# Patient Record
Sex: Male | Born: 1950 | Race: White | Hispanic: No | Marital: Married | Smoking: Current every day smoker
Health system: Southern US, Community
[De-identification: ages and names within clinical notes are randomized; demographics above are authoritative.]

## PROBLEM LIST (undated history)

## (undated) DIAGNOSIS — E119 Type 2 diabetes mellitus without complications: Secondary | ICD-10-CM

## (undated) HISTORY — PX: TONSILLECTOMY: SUR1361

## (undated) HISTORY — PX: CYSTECTOMY: SUR359

---

## 2019-01-15 DIAGNOSIS — E782 Mixed hyperlipidemia: Secondary | ICD-10-CM | POA: Insufficient documentation

## 2019-01-15 DIAGNOSIS — E559 Vitamin D deficiency, unspecified: Secondary | ICD-10-CM | POA: Insufficient documentation

## 2019-01-15 DIAGNOSIS — E875 Hyperkalemia: Secondary | ICD-10-CM | POA: Insufficient documentation

## 2019-01-15 DIAGNOSIS — I499 Cardiac arrhythmia, unspecified: Secondary | ICD-10-CM | POA: Insufficient documentation

## 2019-01-15 DIAGNOSIS — N179 Acute kidney failure, unspecified: Secondary | ICD-10-CM | POA: Insufficient documentation

## 2019-01-15 DIAGNOSIS — I10 Essential (primary) hypertension: Secondary | ICD-10-CM | POA: Insufficient documentation

## 2019-07-16 DIAGNOSIS — E119 Type 2 diabetes mellitus without complications: Secondary | ICD-10-CM | POA: Insufficient documentation

## 2019-07-16 DIAGNOSIS — N1831 Chronic kidney disease, stage 3a: Secondary | ICD-10-CM | POA: Insufficient documentation

## 2020-01-21 DIAGNOSIS — D72828 Other elevated white blood cell count: Secondary | ICD-10-CM | POA: Insufficient documentation

## 2021-10-10 DIAGNOSIS — H9201 Otalgia, right ear: Secondary | ICD-10-CM | POA: Diagnosis not present

## 2021-10-10 DIAGNOSIS — Z720 Tobacco use: Secondary | ICD-10-CM | POA: Diagnosis not present

## 2021-10-10 DIAGNOSIS — J449 Chronic obstructive pulmonary disease, unspecified: Secondary | ICD-10-CM | POA: Diagnosis not present

## 2021-10-10 DIAGNOSIS — Z131 Encounter for screening for diabetes mellitus: Secondary | ICD-10-CM | POA: Diagnosis not present

## 2021-10-10 DIAGNOSIS — E119 Type 2 diabetes mellitus without complications: Secondary | ICD-10-CM | POA: Diagnosis not present

## 2021-10-10 DIAGNOSIS — Z87438 Personal history of other diseases of male genital organs: Secondary | ICD-10-CM | POA: Diagnosis not present

## 2021-10-10 DIAGNOSIS — Z13220 Encounter for screening for lipoid disorders: Secondary | ICD-10-CM | POA: Diagnosis not present

## 2021-10-10 DIAGNOSIS — R351 Nocturia: Secondary | ICD-10-CM | POA: Diagnosis not present

## 2021-10-10 DIAGNOSIS — R42 Dizziness and giddiness: Secondary | ICD-10-CM | POA: Diagnosis not present

## 2021-10-10 DIAGNOSIS — Z8679 Personal history of other diseases of the circulatory system: Secondary | ICD-10-CM | POA: Diagnosis not present

## 2021-10-10 DIAGNOSIS — N400 Enlarged prostate without lower urinary tract symptoms: Secondary | ICD-10-CM | POA: Diagnosis not present

## 2021-11-09 DIAGNOSIS — D75839 Thrombocytosis, unspecified: Secondary | ICD-10-CM | POA: Diagnosis not present

## 2021-11-09 DIAGNOSIS — R42 Dizziness and giddiness: Secondary | ICD-10-CM | POA: Diagnosis not present

## 2021-11-09 DIAGNOSIS — G919 Hydrocephalus, unspecified: Secondary | ICD-10-CM | POA: Diagnosis not present

## 2021-11-09 DIAGNOSIS — Z79899 Other long term (current) drug therapy: Secondary | ICD-10-CM | POA: Diagnosis not present

## 2021-11-09 DIAGNOSIS — R7989 Other specified abnormal findings of blood chemistry: Secondary | ICD-10-CM | POA: Diagnosis not present

## 2021-11-09 DIAGNOSIS — G912 (Idiopathic) normal pressure hydrocephalus: Secondary | ICD-10-CM | POA: Diagnosis not present

## 2021-11-09 DIAGNOSIS — R9431 Abnormal electrocardiogram [ECG] [EKG]: Secondary | ICD-10-CM | POA: Diagnosis not present

## 2021-11-09 DIAGNOSIS — R519 Headache, unspecified: Secondary | ICD-10-CM | POA: Diagnosis not present

## 2021-11-16 ENCOUNTER — Other Ambulatory Visit (HOSPITAL_COMMUNITY): Payer: Self-pay | Admitting: Neurology

## 2021-11-16 ENCOUNTER — Other Ambulatory Visit: Payer: Self-pay | Admitting: Neurology

## 2021-11-16 DIAGNOSIS — G959 Disease of spinal cord, unspecified: Secondary | ICD-10-CM

## 2021-11-16 DIAGNOSIS — R42 Dizziness and giddiness: Secondary | ICD-10-CM | POA: Diagnosis not present

## 2021-11-16 DIAGNOSIS — R296 Repeated falls: Secondary | ICD-10-CM | POA: Diagnosis not present

## 2021-11-16 DIAGNOSIS — E1143 Type 2 diabetes mellitus with diabetic autonomic (poly)neuropathy: Secondary | ICD-10-CM | POA: Diagnosis not present

## 2021-11-16 DIAGNOSIS — I951 Orthostatic hypotension: Secondary | ICD-10-CM | POA: Diagnosis not present

## 2021-11-22 DIAGNOSIS — R279 Unspecified lack of coordination: Secondary | ICD-10-CM | POA: Diagnosis not present

## 2021-11-22 DIAGNOSIS — Z9181 History of falling: Secondary | ICD-10-CM | POA: Diagnosis not present

## 2021-11-22 DIAGNOSIS — R2689 Other abnormalities of gait and mobility: Secondary | ICD-10-CM | POA: Diagnosis not present

## 2021-11-22 DIAGNOSIS — R296 Repeated falls: Secondary | ICD-10-CM | POA: Diagnosis not present

## 2021-11-29 ENCOUNTER — Ambulatory Visit (HOSPITAL_COMMUNITY)
Admission: RE | Admit: 2021-11-29 | Discharge: 2021-11-29 | Disposition: A | Discharge status: Home / Self Care | Payer: Medicare HMO | Source: Ambulatory Visit | Attending: Neurology | Admitting: Neurology

## 2021-11-29 ENCOUNTER — Other Ambulatory Visit: Payer: Self-pay

## 2021-11-29 DIAGNOSIS — R296 Repeated falls: Secondary | ICD-10-CM

## 2021-11-29 DIAGNOSIS — G319 Degenerative disease of nervous system, unspecified: Secondary | ICD-10-CM | POA: Diagnosis not present

## 2021-11-29 DIAGNOSIS — I639 Cerebral infarction, unspecified: Secondary | ICD-10-CM | POA: Diagnosis not present

## 2021-11-29 DIAGNOSIS — G959 Disease of spinal cord, unspecified: Secondary | ICD-10-CM | POA: Diagnosis not present

## 2021-11-29 DIAGNOSIS — R42 Dizziness and giddiness: Secondary | ICD-10-CM | POA: Diagnosis not present

## 2021-11-29 DIAGNOSIS — E1143 Type 2 diabetes mellitus with diabetic autonomic (poly)neuropathy: Secondary | ICD-10-CM | POA: Diagnosis not present

## 2021-11-29 DIAGNOSIS — I951 Orthostatic hypotension: Secondary | ICD-10-CM | POA: Diagnosis not present

## 2021-11-29 DIAGNOSIS — M2578 Osteophyte, vertebrae: Secondary | ICD-10-CM | POA: Diagnosis not present

## 2021-11-29 DIAGNOSIS — M542 Cervicalgia: Secondary | ICD-10-CM | POA: Diagnosis not present

## 2021-11-29 IMAGING — MR MR HEAD W/O CM
19 of 20 series · 44 of 48 positions shown · non-contrast
Comparison: Head CT [DATE].

CLINICAL DATA: Provided history: Recurrent falls. Cervical
myelopathy. Disease of spinal cord, unspecified.

EXAM:
MRI HEAD WITHOUT CONTRAST
TECHNIQUE: Multiplanar, multiecho pulse sequences of the brain and surrounding
structures were obtained without intravenous contrast.

[Series 5: DWI · axial · 4.0mm · 0.88mm/px · z∈[-64,+74]mm · 2 of 36 slices shown (1 of 6)]
[im 1/36]
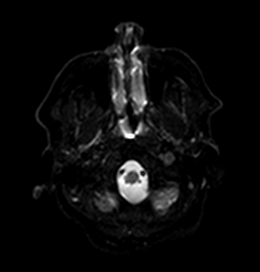
[im 36/36]
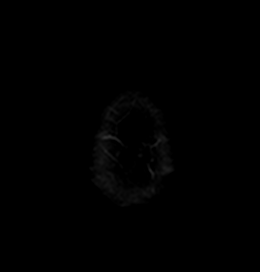

[Series 5: DWI · axial · 4.0mm · 0.88mm/px · z∈[-64,+74]mm · 2 of 36 slices shown (2 of 6)]
[im 1/36]
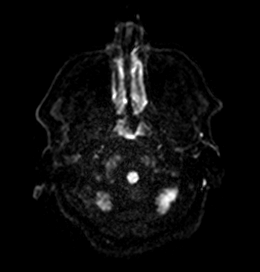
[im 36/36]
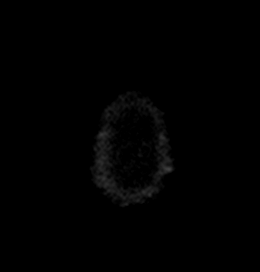

[Series 6: DWI · axial · 4.0mm · 0.88mm/px · z∈[-64,+74]mm · 2 of 36 slices shown (3 of 6)]
[im 1/36]
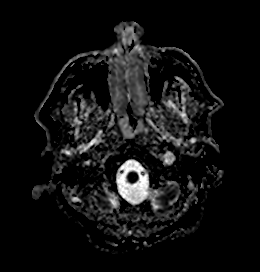
[im 36/36]
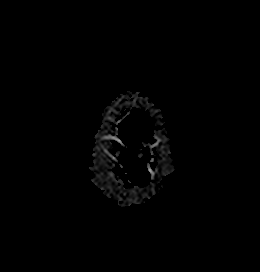

[Series 7: DWI · coronal · 5.0mm · 0.88mm/px · 2 of 30 slices shown (4 of 6)]
[im 1/30]
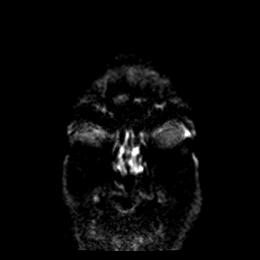
[im 30/30]
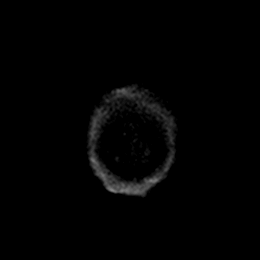

[Series 7: DWI · coronal · 5.0mm · 0.88mm/px · 2 of 30 slices shown (5 of 6)]
[im 1/30]
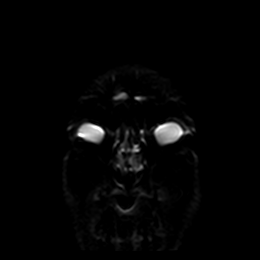
[im 30/30]
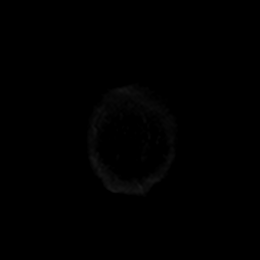

[Series 8: DWI · coronal · 5.0mm · 0.88mm/px · 2 of 30 slices shown (6 of 6)]
[im 1/30]
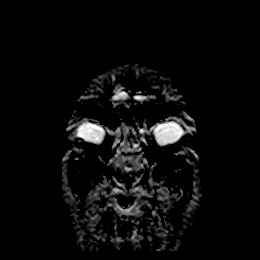
[im 30/30]
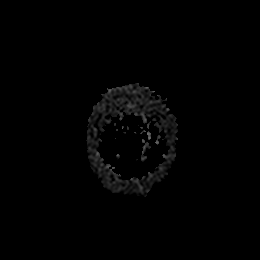

[Series 9: T1 · sagittal · 5.0mm · 0.94mm/px · 2 of 23 slices shown (1 of 2)]
[im 1/23]
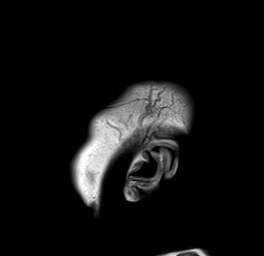
[im 23/23]
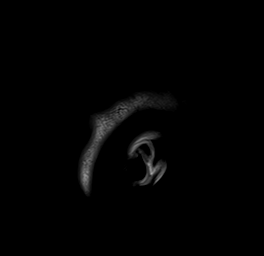

[Series 10: T2 · axial · 5.0mm · 0.72mm/px · z∈[-61,+70]mm · 2 of 20 slices shown (1 of 4)]
[im 1/20]
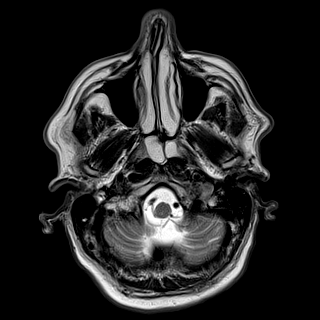
[im 20/20]
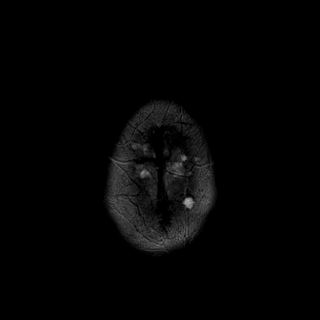

[Series 11: ax hemo · axial · 5.0mm · 0.86mm/px · z∈[-65,+77]mm · 2 of 25 slices shown]
[im 1/25]
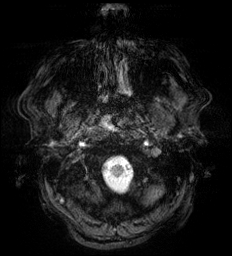
[im 25/25]
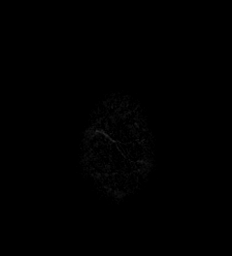

[Series 12: FLAIR · axial · 4.0mm · 0.43mm/px · z∈[-63,+76]mm · 3 of 36 slices shown]
[im 1/36]
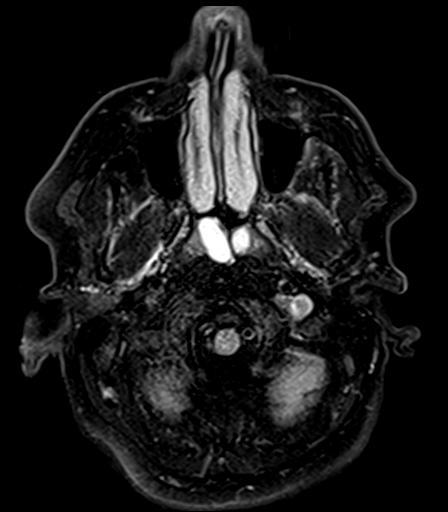
[im 18/36]
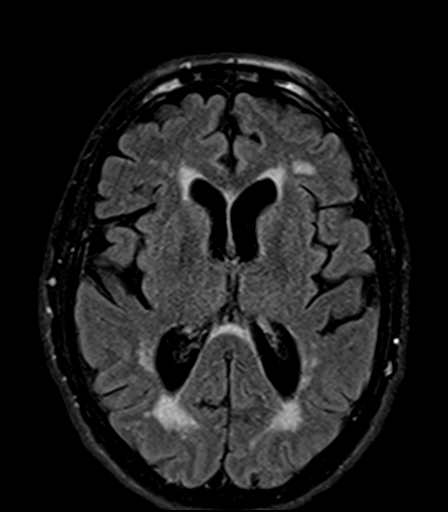
[im 36/36]
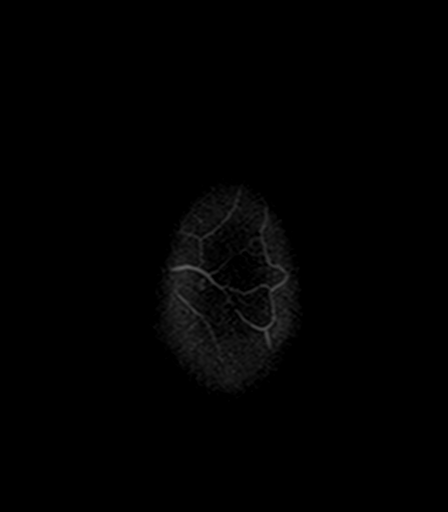

[Series 13: mag_images · axial · 3.0mm · 0.90mm/px · z∈[-63,+77]mm · 4 of 48 slices shown]
[im 1/48]
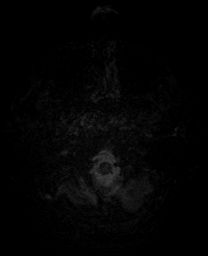
[im 16/48]
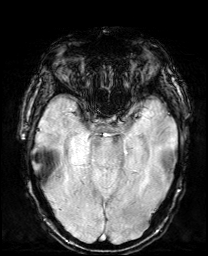
[im 32/48]
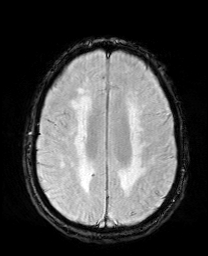
[im 48/48]
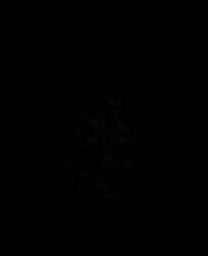

[Series 14: pha_images · axial · 3.0mm · 0.90mm/px · z∈[-63,+74]mm · 4 of 47 slices shown]
[im 1/47]
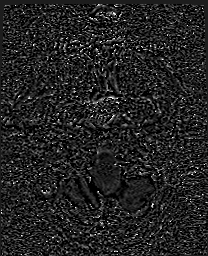
[im 16/47]
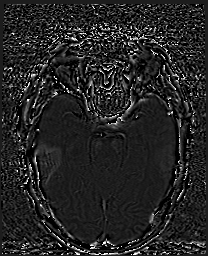
[im 31/47]
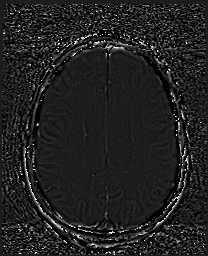
[im 47/47]
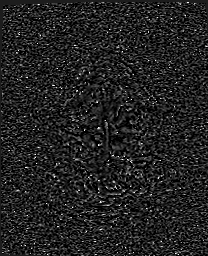

[Series 15: swi_images · axial · 3.0mm · 0.90mm/px · z∈[-63,+77]mm · 4 of 48 slices shown]
[im 1/48]
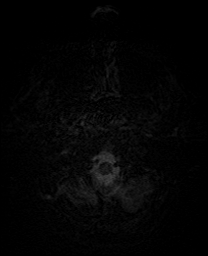
[im 16/48]
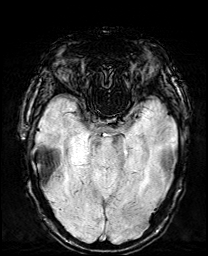
[im 32/48]
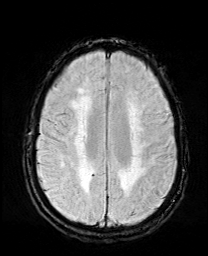
[im 48/48]
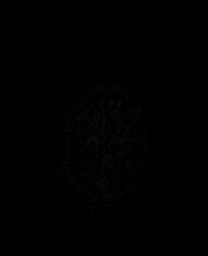

[Series 18: T2 · coronal · 5.0mm · 0.72mm/px · 2 of 28 slices shown (2 of 4)]
[im 1/28]
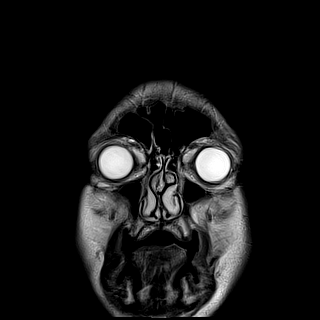
[im 28/28]
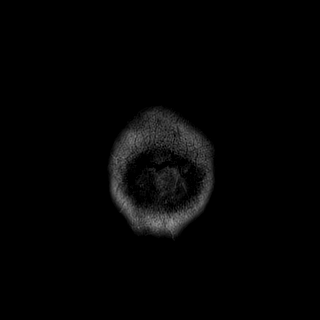

[Series 23: T2 · sagittal · 3.0mm · 0.69mm/px · 1 of 15 slices shown (3 of 4)]
[im 1/15]
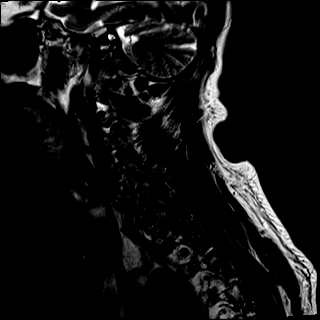

[Series 24: T1 · sagittal · 3.0mm · 0.86mm/px · 1 of 15 slices shown (2 of 2)]
[im 1/15]
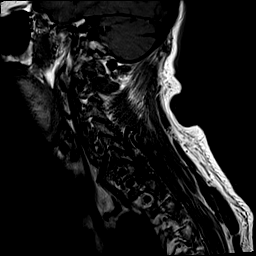

[Series 25: STIR · sagittal · 3.0mm · 0.69mm/px · 1 of 15 slices shown]
[im 1/15]
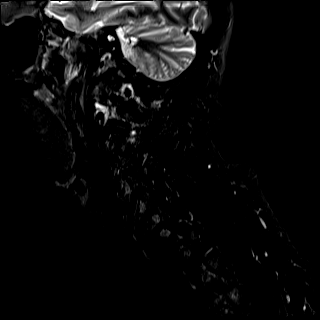

[Series 26: T2 · axial · 3.0mm · 0.70mm/px · z∈[-239,-138]mm · 3 of 35 slices shown (4 of 4)]
[im 1/35]
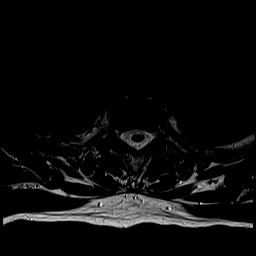
[im 18/35]
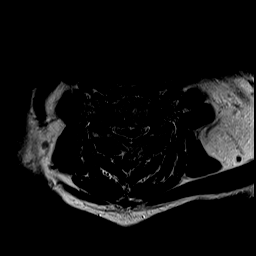
[im 35/35]
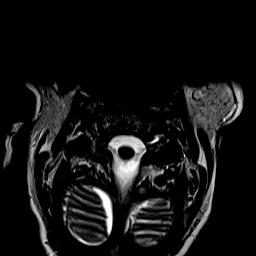

[Series 27: GRE · axial · 3.0mm · 0.35mm/px · z∈[-239,-138]mm · 3 of 35 slices shown]
[im 1/35]
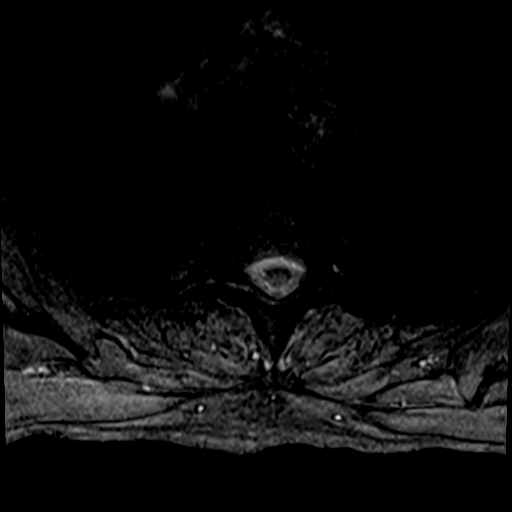
[im 18/35]
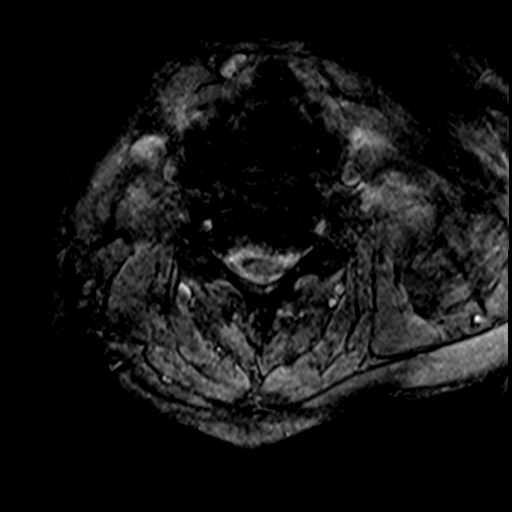
[im 35/35]
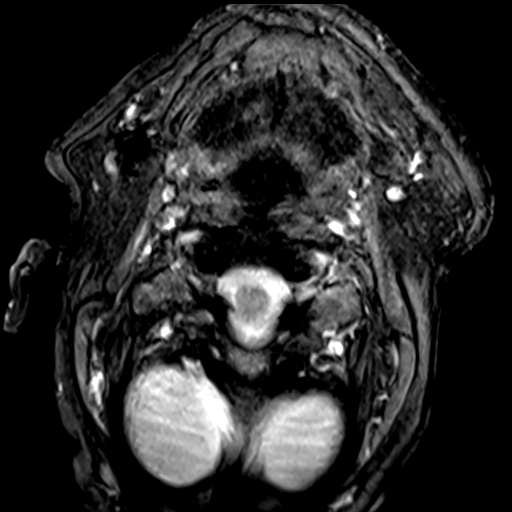

[44 of 48 positions shown; findings below may reference images not displayed]

FINDINGS: Brain:

Mild intermittent motion degradation.

Mild generalized cerebral atrophy.

There is a 7 mm focus of diffusion weighted signal hyperintensity
within the posterior right frontal lobe subcortical white matter
(within the medial precentral gyrus). There is corresponding sign
through at this site on the ADC map, and this is compatible with a
subacute infarct.

Background advanced patchy and confluent T2 FLAIR hyperintense
signal abnormality within the cerebral white matter, nonspecific but
compatible with chronic small vessel ischemic disease. Mild chronic
small vessel ischemic changes are also present within the pons.

Prominent perivascular space versus chronic lacunar infarct within
the left thalamus (series 10, image 10).

There is no acute infarct.

No evidence of an intracranial mass.

No chronic intracranial blood products.

No extra-axial fluid collection.

No midline shift.

Vascular: Maintained flow voids within the proximal large arterial
vessels.

Skull and upper cervical spine: No focal suspicious marrow lesion.
Incompletely assessed cervical spondylosis.

Sinuses/Orbits: Visualized orbits show no acute finding. Bilateral
lens replacements. Small mucous retention cysts within the right
sphenoid sinus.

Other: Mucous retention cysts within the nasopharynx measuring up to
19 mm.

Impresion #2 was called by telephone at the time of interpretation
on [DATE] at [DATE] to provider MERCED , who verbally
acknowledged these results. Dr. MERCED requested that the patient
contact his office after completing his MRI examinations. This was
communicated to the patient via the MRI technologist staff.
IMPRESSION: Mildly motion degraded exam.

7 mm subacute infarct within the posterior right frontal lobe
subcortical white matter (within the right precentral gyrus).

Background chronic small vessel ischemic changes which are advanced
in the cerebral white matter, and mild in the pons.

Prominent perivascular space versus chronic lacunar infarct within
the left thalamus.

Mild generalized cerebral and cerebellar atrophy.

Small mucous retention cyst within the right sphenoid sinus.

Mucous retention cysts within the nasopharynx measuring up to 19 mm.

## 2021-11-29 IMAGING — MR MR CERVICAL SPINE W/O CM
5 series · 38 of 48 positions shown · non-contrast
Comparison: None.

CLINICAL DATA: Falls. Cervical myelopathy. Headache and neck pain
extending into the upper extremities. Weakness.

EXAM:
MRI CERVICAL SPINE WITHOUT CONTRAST
TECHNIQUE: Multiplanar, multisequence MR imaging of the cervical spine was
performed. No intravenous contrast was administered.

[Series 23: T2 · sagittal · 3.0mm · 0.69mm/px · 6 of 15 slices shown (1 of 2)]
[im 1/15]
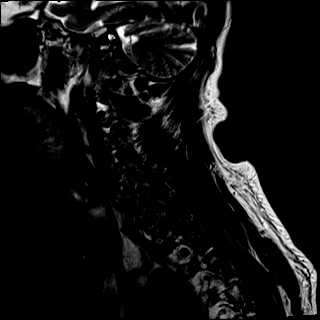
[im 3/15]
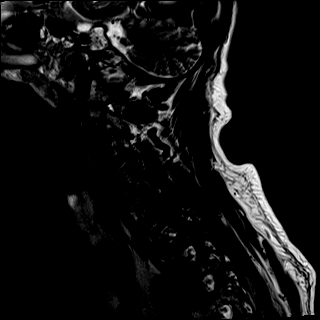
[im 6/15]
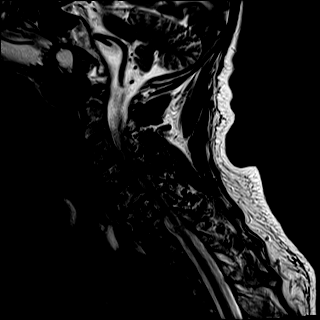
[im 9/15]
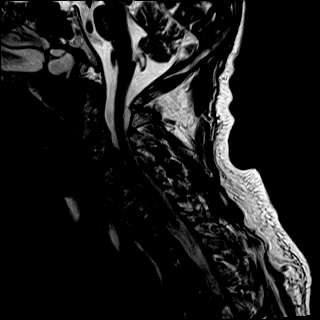
[im 12/15]
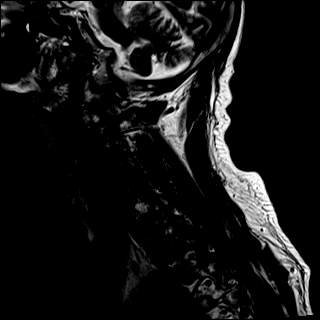
[im 15/15]
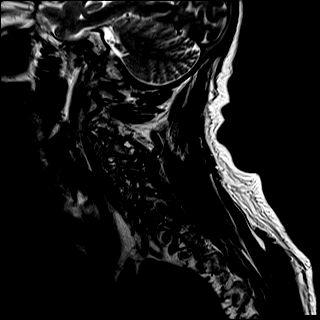

[Series 24: T1 · sagittal · 3.0mm · 0.86mm/px · 6 of 15 slices shown]
[im 1/15]
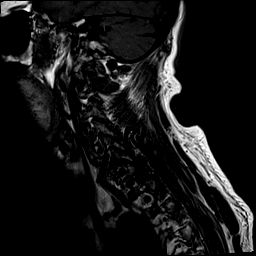
[im 3/15]
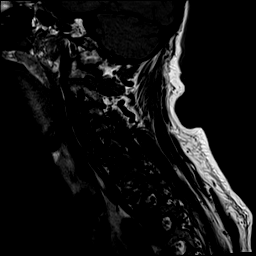
[im 6/15]
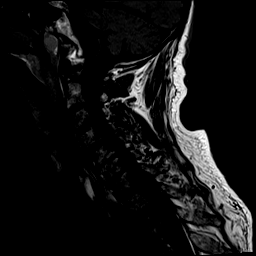
[im 9/15]
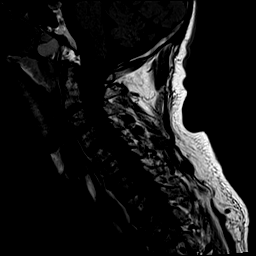
[im 12/15]
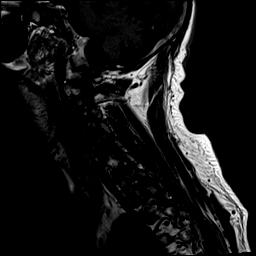
[im 15/15]
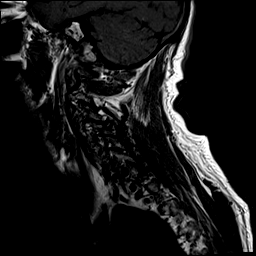

[Series 25: STIR · sagittal · 3.0mm · 0.69mm/px · 6 of 15 slices shown]
[im 1/15]
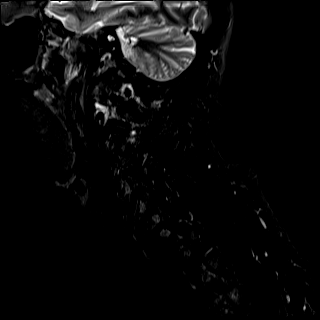
[im 3/15]
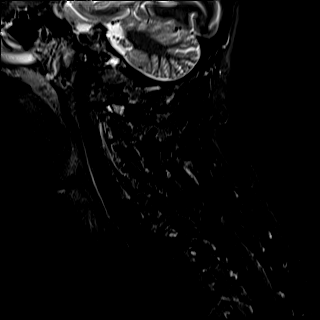
[im 6/15]
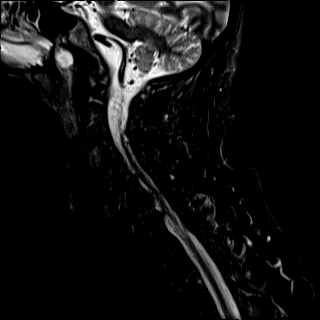
[im 9/15]
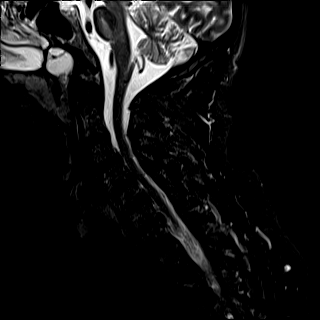
[im 12/15]
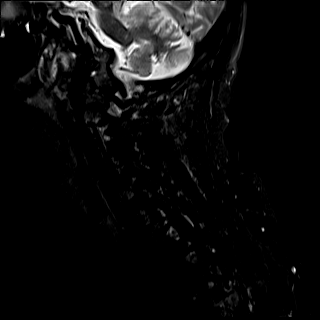
[im 15/15]
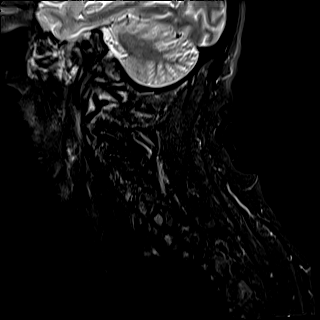

[Series 26: T2 · axial · 3.0mm · 0.70mm/px · z∈[-239,-138]mm · 12 of 35 slices shown (2 of 2)]
[im 1/35]
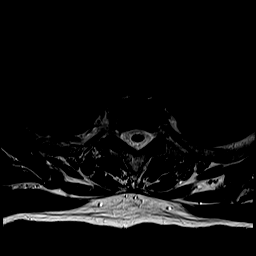
[im 3/35]
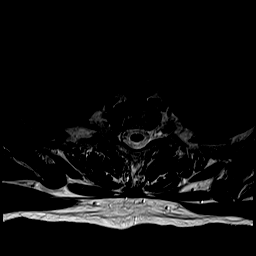
[im 5/35]
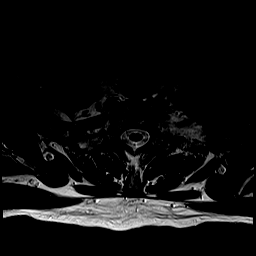
[im 8/35]
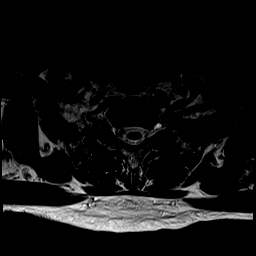
[im 10/35]
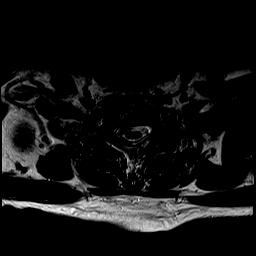
[im 13/35]
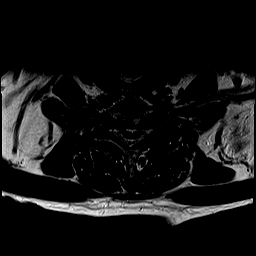
[im 15/35]
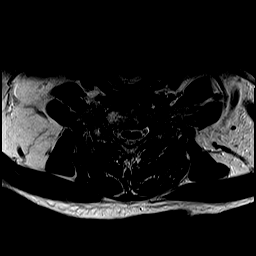
[im 18/35]
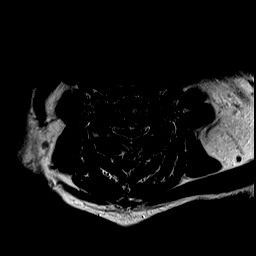
[im 20/35]
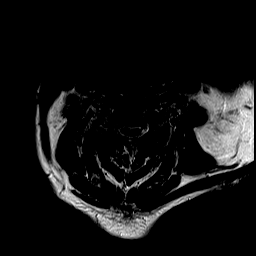
[im 25/35]
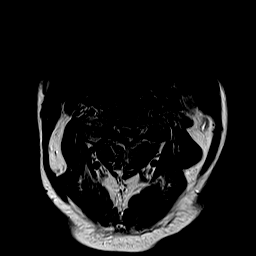
[im 30/35]
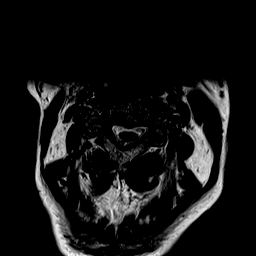
[im 35/35]
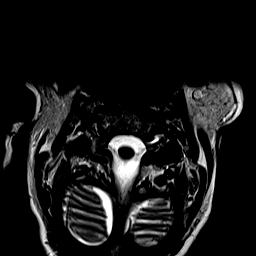

[Series 27: GRE · axial · 3.0mm · 0.35mm/px · z∈[-239,-138]mm · 8 of 35 slices shown]
[im 1/35]
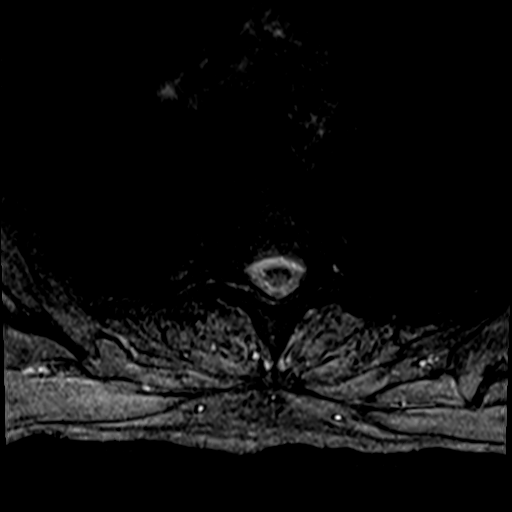
[im 5/35]
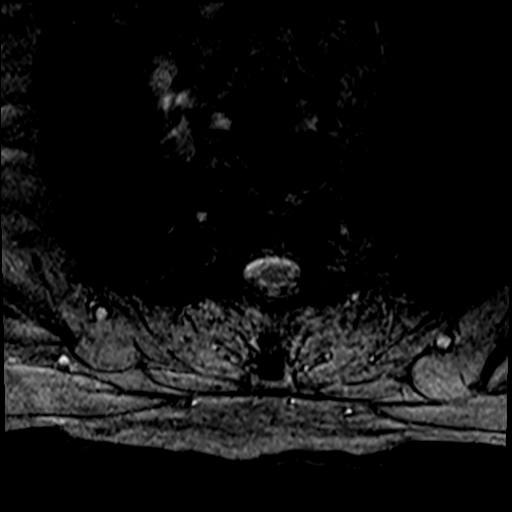
[im 10/35]
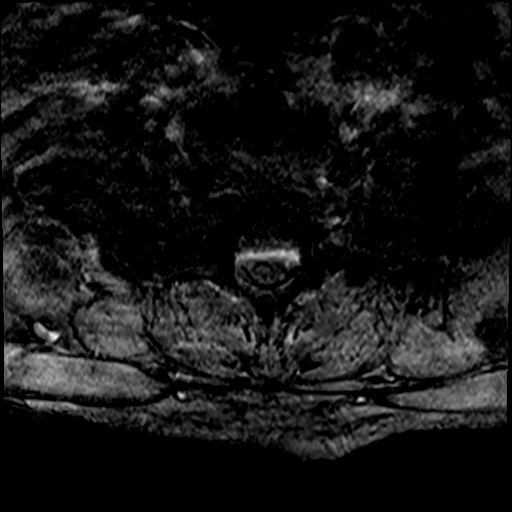
[im 15/35]
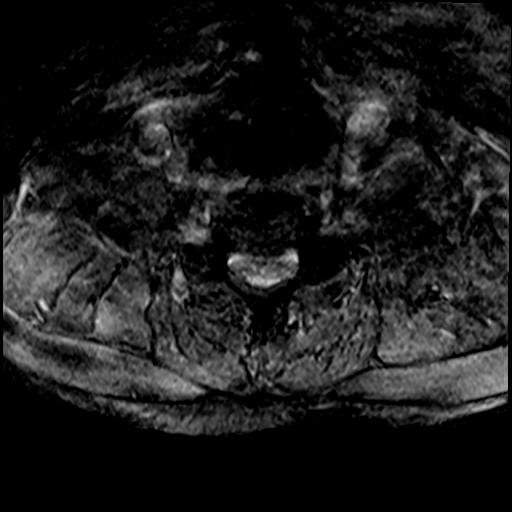
[im 20/35]
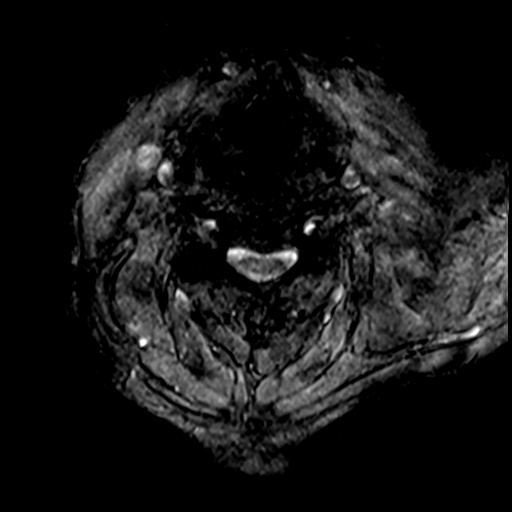
[im 25/35]
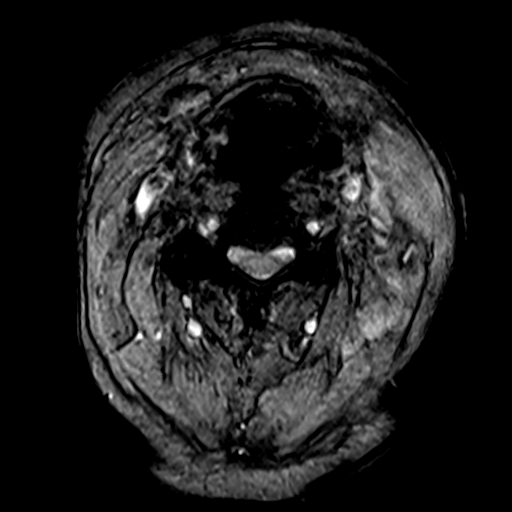
[im 30/35]
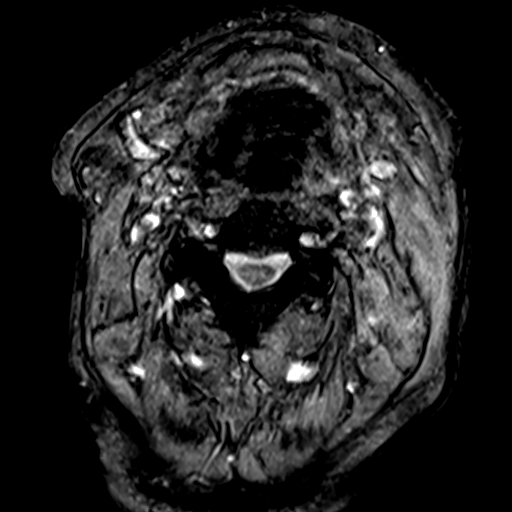
[im 35/35]
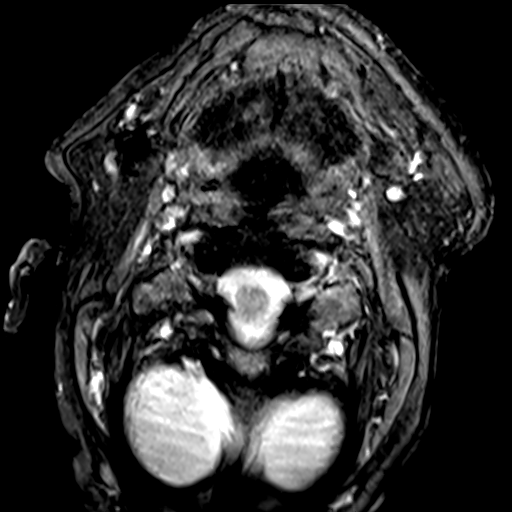

[38 of 48 positions shown; findings below may reference images not displayed]

FINDINGS: Alignment: No significant listhesis is present. Straightening of
normal cervical lordosis is noted.

Vertebrae: Fatty endplate marrow changes are present at C5-6.
Scattered fatty infiltration marrow spaces is noted. Vertebral body
heights are normal.

Cord: Normal signal and morphology.

Posterior Fossa, vertebral arteries, paraspinal tissues: Prominent
normal cyst noted. Craniocervical junction within normal limits.
Visualized intracranial contents are within normal limits. Flow is
present in the vertebral arteries bilaterally.

Disc levels:

C2-3: Asymmetric right-sided uncovertebral and facet hypertrophy
results in moderate right foraminal stenosis.

C3-4: A rightward disc osteophyte complex partially effaces the
ventral CSF and distorts the right side of the cord without abnormal
signal. Severe right and moderate left foraminal narrowing is
present.

C4-5: A leftward disc osteophyte complex present. Partial effacement
of the ventral CSF is noted. Moderate foraminal narrowing is present
bilaterally.

C5-6: A broad-based disc osteophyte complex is present. Partial
effacement of ventral CSF noted. Uncovertebral spurring results in
moderate to severe foraminal narrowing bilaterally, left greater
than right.

C6-7: A broad-based disc protrusion is present. Uncovertebral
spurring is present bilaterally. This results in moderate foraminal
narrowing bilaterally. Central canal is patent.

C7-T1: Negative.
IMPRESSION: 1. Multilevel spondylosis of the cervical spine as described.
2. Moderate right foraminal stenosis at C2-3.
3. Severe right and moderate left foraminal stenosis at C3-4.
4. Moderate right central canal stenosis at C3-4 some distortion of
the right side of the cord but no abnormal signal.
5. Moderate foraminal narrowing bilaterally at C4-5.
6. Moderate to severe foraminal narrowing bilaterally at C5-6 is
worse on the left.
7. Moderate foraminal narrowing bilaterally at C6-7.

## 2021-11-30 ENCOUNTER — Other Ambulatory Visit: Payer: Self-pay | Admitting: Neurology

## 2021-11-30 ENCOUNTER — Other Ambulatory Visit (HOSPITAL_COMMUNITY): Payer: Self-pay | Admitting: Neurology

## 2021-11-30 DIAGNOSIS — I639 Cerebral infarction, unspecified: Secondary | ICD-10-CM

## 2021-12-05 DIAGNOSIS — H903 Sensorineural hearing loss, bilateral: Secondary | ICD-10-CM | POA: Diagnosis not present

## 2021-12-05 DIAGNOSIS — H6122 Impacted cerumen, left ear: Secondary | ICD-10-CM | POA: Diagnosis not present

## 2021-12-05 DIAGNOSIS — H9313 Tinnitus, bilateral: Secondary | ICD-10-CM | POA: Diagnosis not present

## 2021-12-05 DIAGNOSIS — R42 Dizziness and giddiness: Secondary | ICD-10-CM | POA: Diagnosis not present

## 2021-12-05 DIAGNOSIS — H9201 Otalgia, right ear: Secondary | ICD-10-CM | POA: Diagnosis not present

## 2021-12-06 DIAGNOSIS — R296 Repeated falls: Secondary | ICD-10-CM | POA: Diagnosis not present

## 2021-12-06 DIAGNOSIS — R279 Unspecified lack of coordination: Secondary | ICD-10-CM | POA: Diagnosis not present

## 2021-12-06 DIAGNOSIS — R2689 Other abnormalities of gait and mobility: Secondary | ICD-10-CM | POA: Diagnosis not present

## 2021-12-06 DIAGNOSIS — Z9181 History of falling: Secondary | ICD-10-CM | POA: Diagnosis not present

## 2021-12-14 DIAGNOSIS — R42 Dizziness and giddiness: Secondary | ICD-10-CM | POA: Diagnosis not present

## 2021-12-21 ENCOUNTER — Ambulatory Visit (HOSPITAL_COMMUNITY)
Admission: RE | Admit: 2021-12-21 | Discharge: 2021-12-21 | Disposition: A | Discharge status: Home / Self Care | Payer: Medicare HMO | Source: Ambulatory Visit | Attending: Neurology | Admitting: Neurology

## 2021-12-21 ENCOUNTER — Other Ambulatory Visit: Payer: Self-pay

## 2021-12-21 DIAGNOSIS — R531 Weakness: Secondary | ICD-10-CM | POA: Diagnosis not present

## 2021-12-21 DIAGNOSIS — R42 Dizziness and giddiness: Secondary | ICD-10-CM | POA: Diagnosis not present

## 2021-12-21 DIAGNOSIS — Z8673 Personal history of transient ischemic attack (TIA), and cerebral infarction without residual deficits: Secondary | ICD-10-CM | POA: Diagnosis not present

## 2021-12-21 DIAGNOSIS — I6523 Occlusion and stenosis of bilateral carotid arteries: Secondary | ICD-10-CM | POA: Diagnosis not present

## 2021-12-21 DIAGNOSIS — I639 Cerebral infarction, unspecified: Secondary | ICD-10-CM | POA: Diagnosis not present

## 2021-12-21 LAB — POCT I-STAT CREATININE: Creatinine, Ser: 1.5 mg/dL — ABNORMAL HIGH (ref 0.61–1.24)

## 2021-12-21 IMAGING — CT CT ANGIO HEAD-NECK (W OR W/O PERF)
1 of 12 series · 4 of 33 positions shown · IV contrast (APPLIED)
Comparison: CT head [DATE]

CLINICAL DATA: Weakness and dizziness.  Recent stroke.

EXAM:
CT ANGIOGRAPHY HEAD AND NECK
TECHNIQUE: Multidetector CT imaging of the head and neck was performed using
the standard protocol during bolus administration of intravenous
contrast. Multiplanar CT image reconstructions and MIPs were
obtained to evaluate the vascular anatomy. Carotid stenosis
measurements (when applicable) are obtained utilizing NASCET
criteria, using the distal internal carotid diameter as the
denominator.
CONTRAST:  75mL OMNIPAQUE IOHEXOL 350 MG/ML SOLN

[Series 8: ax thin · axial · 0.33mm/px · z∈[-290,-89]mm · 4 of 337 slices shown]
[im 68/337  soft-tissue]
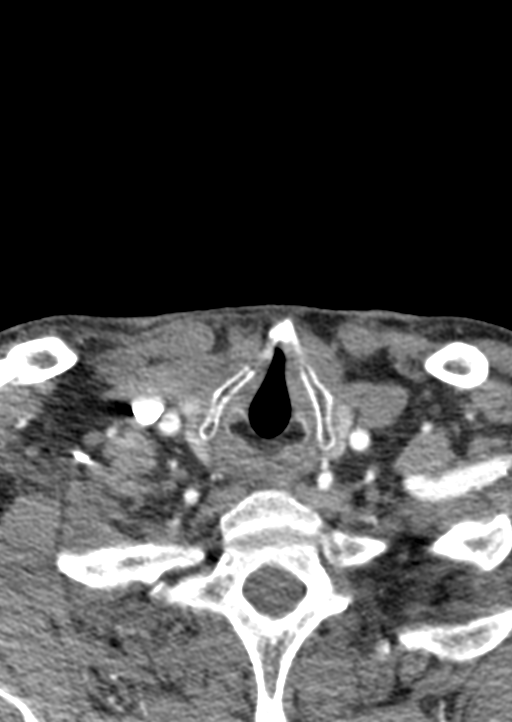
[im 135/337  bone]
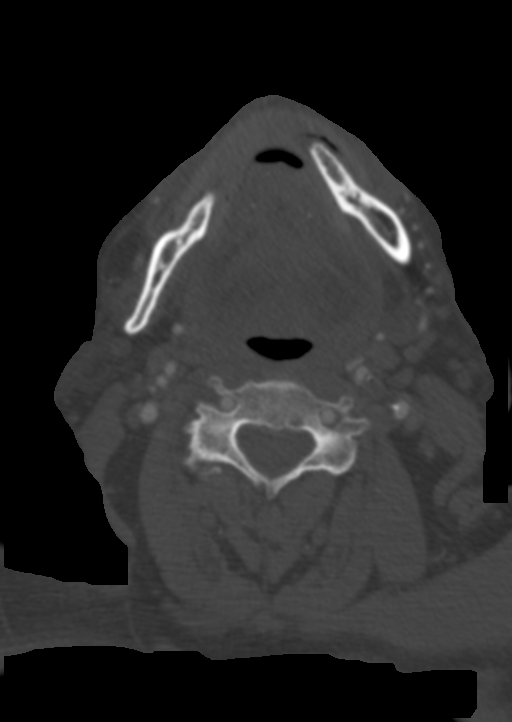
[im 202/337  soft-tissue]
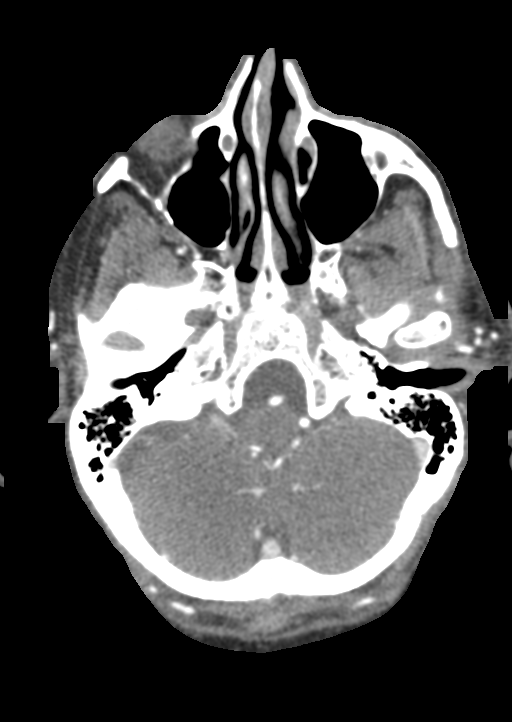
[im 269/337  bone]
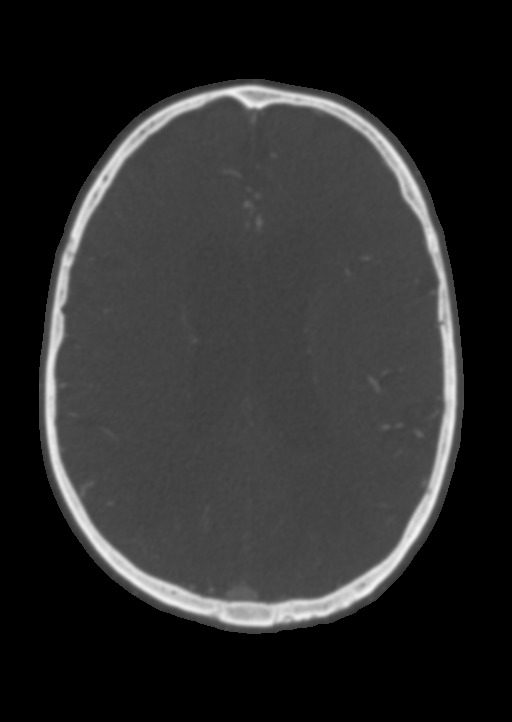

[4 of 33 positions shown; findings below may reference images not displayed]

FINDINGS: CT HEAD FINDINGS

Brain: Mild to moderate atrophy. Moderate white matter changes
stable from the prior study.

Negative for acute cortical infarct, hemorrhage, mass.

Vascular: Negative for hyperdense vessel

Skull: Negative

Sinuses: Mild mucosal edema paranasal sinuses.

Orbits: Bilateral cataract extraction

Review of the MIP images confirms the above findings

CTA NECK FINDINGS

Aortic arch: Mild atherosclerotic calcification aortic arch.
Proximal great vessels patent. Atherosclerotic mild stenosis
proximal left subclavian artery.

Right carotid system: Mild atherosclerotic disease right carotid
bifurcation without significant stenosis.

Left carotid system: Atherosclerotic disease left carotid
bifurcation. 25% diameter stenosis proximal left internal carotid
artery.

Vertebral arteries: Both vertebral arteries widely patent without
stenosis or dissection.

Skeleton: Cervical spondylosis and facet degeneration. No acute
skeletal abnormality.

Other neck: Negative for mass or adenopathy in the neck.

Upper chest: Mild apical emphysema. No acute abnormality. 12 mm
dermal cyst subcutaneous tissues left anterior chest.

Review of the MIP images confirms the above findings

CTA HEAD FINDINGS

Anterior circulation: Mild atherosclerotic disease in the cavernous
carotid bilaterally without stenosis. Anterior and middle cerebral
arteries normal bilaterally.

Posterior circulation: Both vertebral arteries patent to the
basilar. PICA patent bilaterally. Basilar widely patent. Superior
cerebellar and posterior cerebral arteries widely patent and normal
bilaterally.

Venous sinuses: Normal venous enhancement

Anatomic variants: None

Review of the MIP images confirms the above findings
IMPRESSION: 1. No acute intracranial abnormality. Atrophy and moderate chronic
microvascular ischemic change in the white matter.
2. Atherosclerotic disease the carotid bifurcation. Right carotid
widely patent. 25% diameter stenosis proximal left internal carotid
artery.
3. Both vertebral arteries widely patent
4. Intracranial circulation widely patent. Negative for large vessel
occlusion or significant stenosis.

## 2021-12-21 MED ORDER — IOHEXOL 350 MG/ML SOLN
75.0000 mL | Freq: Once | INTRAVENOUS | Status: AC | PRN
  Administered 2021-12-21: 75 mL via INTRAVENOUS

## 2021-12-23 DIAGNOSIS — R42 Dizziness and giddiness: Secondary | ICD-10-CM | POA: Diagnosis not present

## 2021-12-23 DIAGNOSIS — H832X3 Labyrinthine dysfunction, bilateral: Secondary | ICD-10-CM | POA: Diagnosis not present

## 2021-12-30 ENCOUNTER — Emergency Department (HOSPITAL_COMMUNITY)
Admission: EM | Admit: 2021-12-30 | Discharge: 2021-12-31 | Disposition: A | Discharge status: Home / Self Care | Payer: Medicare HMO | Attending: Emergency Medicine | Admitting: Emergency Medicine

## 2021-12-30 ENCOUNTER — Encounter (HOSPITAL_COMMUNITY): Payer: Self-pay

## 2021-12-30 ENCOUNTER — Other Ambulatory Visit: Payer: Self-pay

## 2021-12-30 ENCOUNTER — Emergency Department (HOSPITAL_COMMUNITY): Payer: Medicare HMO

## 2021-12-30 DIAGNOSIS — Z5321 Procedure and treatment not carried out due to patient leaving prior to being seen by health care provider: Secondary | ICD-10-CM | POA: Insufficient documentation

## 2021-12-30 DIAGNOSIS — N183 Chronic kidney disease, stage 3 unspecified: Secondary | ICD-10-CM | POA: Diagnosis not present

## 2021-12-30 DIAGNOSIS — M79674 Pain in right toe(s): Secondary | ICD-10-CM | POA: Diagnosis not present

## 2021-12-30 DIAGNOSIS — R9431 Abnormal electrocardiogram [ECG] [EKG]: Secondary | ICD-10-CM | POA: Diagnosis not present

## 2021-12-30 DIAGNOSIS — R230 Cyanosis: Secondary | ICD-10-CM | POA: Diagnosis not present

## 2021-12-30 DIAGNOSIS — Z7984 Long term (current) use of oral hypoglycemic drugs: Secondary | ICD-10-CM | POA: Insufficient documentation

## 2021-12-30 DIAGNOSIS — E119 Type 2 diabetes mellitus without complications: Secondary | ICD-10-CM | POA: Diagnosis not present

## 2021-12-30 DIAGNOSIS — E1122 Type 2 diabetes mellitus with diabetic chronic kidney disease: Secondary | ICD-10-CM | POA: Diagnosis not present

## 2021-12-30 DIAGNOSIS — Z7982 Long term (current) use of aspirin: Secondary | ICD-10-CM | POA: Diagnosis not present

## 2021-12-30 DIAGNOSIS — Z79899 Other long term (current) drug therapy: Secondary | ICD-10-CM | POA: Diagnosis not present

## 2021-12-30 DIAGNOSIS — D72829 Elevated white blood cell count, unspecified: Secondary | ICD-10-CM | POA: Diagnosis not present

## 2021-12-30 DIAGNOSIS — L98499 Non-pressure chronic ulcer of skin of other sites with unspecified severity: Secondary | ICD-10-CM | POA: Diagnosis not present

## 2021-12-30 DIAGNOSIS — F17210 Nicotine dependence, cigarettes, uncomplicated: Secondary | ICD-10-CM | POA: Diagnosis not present

## 2021-12-30 DIAGNOSIS — I739 Peripheral vascular disease, unspecified: Secondary | ICD-10-CM | POA: Diagnosis not present

## 2021-12-30 DIAGNOSIS — I75021 Atheroembolism of right lower extremity: Secondary | ICD-10-CM | POA: Diagnosis not present

## 2021-12-30 DIAGNOSIS — I129 Hypertensive chronic kidney disease with stage 1 through stage 4 chronic kidney disease, or unspecified chronic kidney disease: Secondary | ICD-10-CM | POA: Insufficient documentation

## 2021-12-30 DIAGNOSIS — N040 Nephrotic syndrome with minor glomerular abnormality: Secondary | ICD-10-CM | POA: Diagnosis not present

## 2021-12-30 DIAGNOSIS — M79604 Pain in right leg: Secondary | ICD-10-CM | POA: Diagnosis not present

## 2021-12-30 DIAGNOSIS — I75029 Atheroembolism of unspecified lower extremity: Secondary | ICD-10-CM | POA: Diagnosis not present

## 2021-12-30 HISTORY — DX: Type 2 diabetes mellitus without complications: E11.9

## 2021-12-30 LAB — CBC WITH DIFFERENTIAL/PLATELET
Abs Immature Granulocytes: 0.07 10*3/uL (ref 0.00–0.07)
Basophils Absolute: 0.2 10*3/uL — ABNORMAL HIGH (ref 0.0–0.1)
Basophils Relative: 1 %
Eosinophils Absolute: 0.5 10*3/uL (ref 0.0–0.5)
Eosinophils Relative: 3 %
HCT: 47.7 % (ref 39.0–52.0)
Hemoglobin: 16 g/dL (ref 13.0–17.0)
Immature Granulocytes: 1 %
Lymphocytes Relative: 19 %
Lymphs Abs: 2.6 10*3/uL (ref 0.7–4.0)
MCH: 31 pg (ref 26.0–34.0)
MCHC: 33.5 g/dL (ref 30.0–36.0)
MCV: 92.4 fL (ref 80.0–100.0)
Monocytes Absolute: 0.9 10*3/uL (ref 0.1–1.0)
Monocytes Relative: 7 %
Neutro Abs: 9.7 10*3/uL — ABNORMAL HIGH (ref 1.7–7.7)
Neutrophils Relative %: 69 %
Platelets: 609 10*3/uL — ABNORMAL HIGH (ref 150–400)
RBC: 5.16 MIL/uL (ref 4.22–5.81)
RDW: 15.6 % — ABNORMAL HIGH (ref 11.5–15.5)
WBC: 13.9 10*3/uL — ABNORMAL HIGH (ref 4.0–10.5)
nRBC: 0 % (ref 0.0–0.2)

## 2021-12-30 LAB — COMPREHENSIVE METABOLIC PANEL
ALT: 17 U/L (ref 0–44)
AST: 15 U/L (ref 15–41)
Albumin: 4.4 g/dL (ref 3.5–5.0)
Alkaline Phosphatase: 56 U/L (ref 38–126)
Anion gap: 8 (ref 5–15)
BUN: 22 mg/dL (ref 8–23)
CO2: 25 mmol/L (ref 22–32)
Calcium: 9.8 mg/dL (ref 8.9–10.3)
Chloride: 105 mmol/L (ref 98–111)
Creatinine, Ser: 1.43 mg/dL — ABNORMAL HIGH (ref 0.61–1.24)
GFR, Estimated: 53 mL/min — ABNORMAL LOW (ref >60)
Glucose, Bld: 85 mg/dL (ref 70–99)
Potassium: 4 mmol/L (ref 3.5–5.1)
Sodium: 138 mmol/L (ref 135–145)
Total Bilirubin: 0.7 mg/dL (ref 0.3–1.2)
Total Protein: 7.1 g/dL (ref 6.5–8.1)

## 2021-12-30 IMAGING — CR DG FOOT COMPLETE 3+V*R*
3 series · 3 of 3 positions shown · non-contrast
Comparison: None.

CLINICAL DATA: Discolored toes

EXAM:
RIGHT FOOT COMPLETE - 3+ VIEW

[x foot ap right]
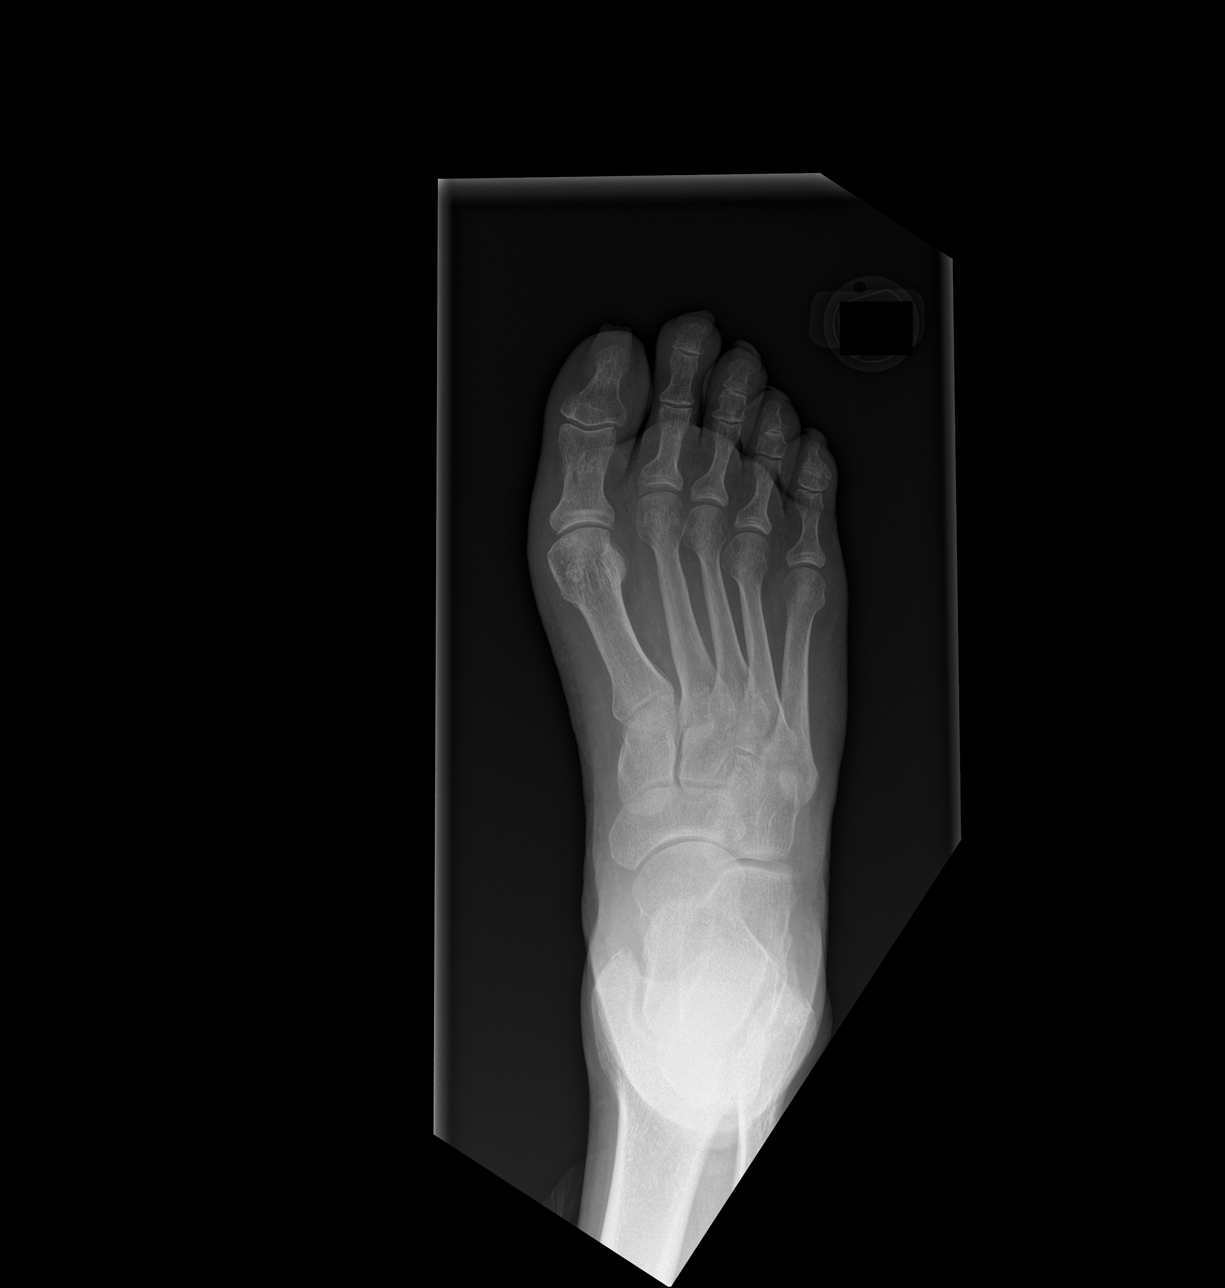

[x foot obl right]
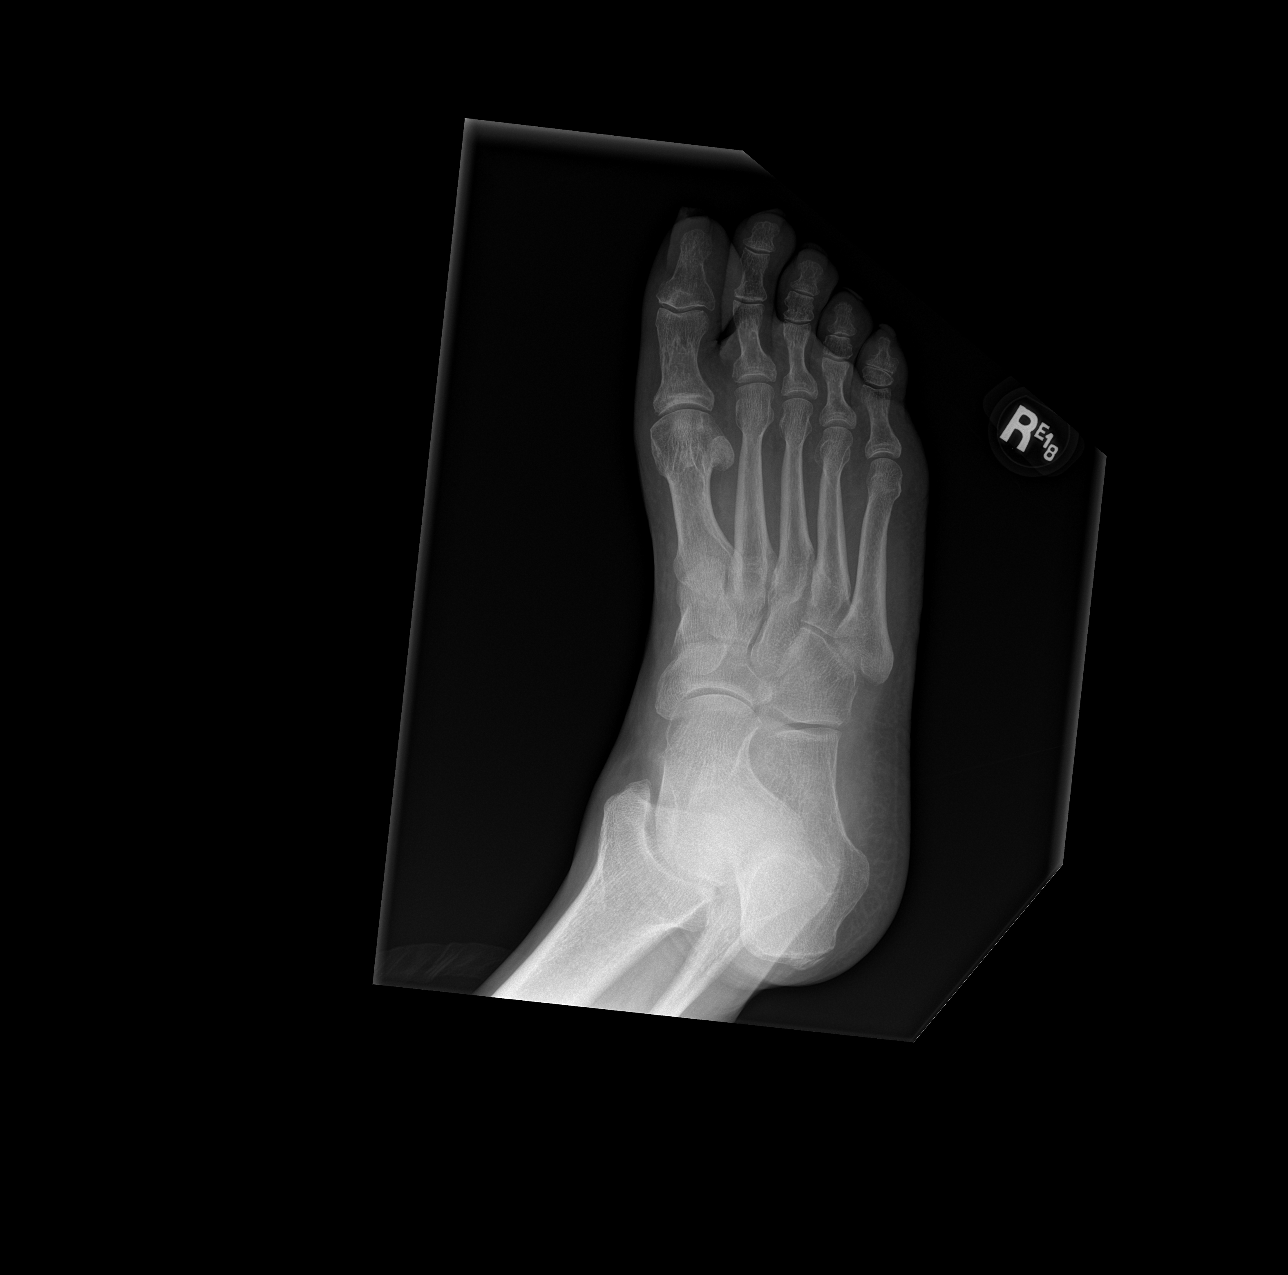

[x foot lat right]
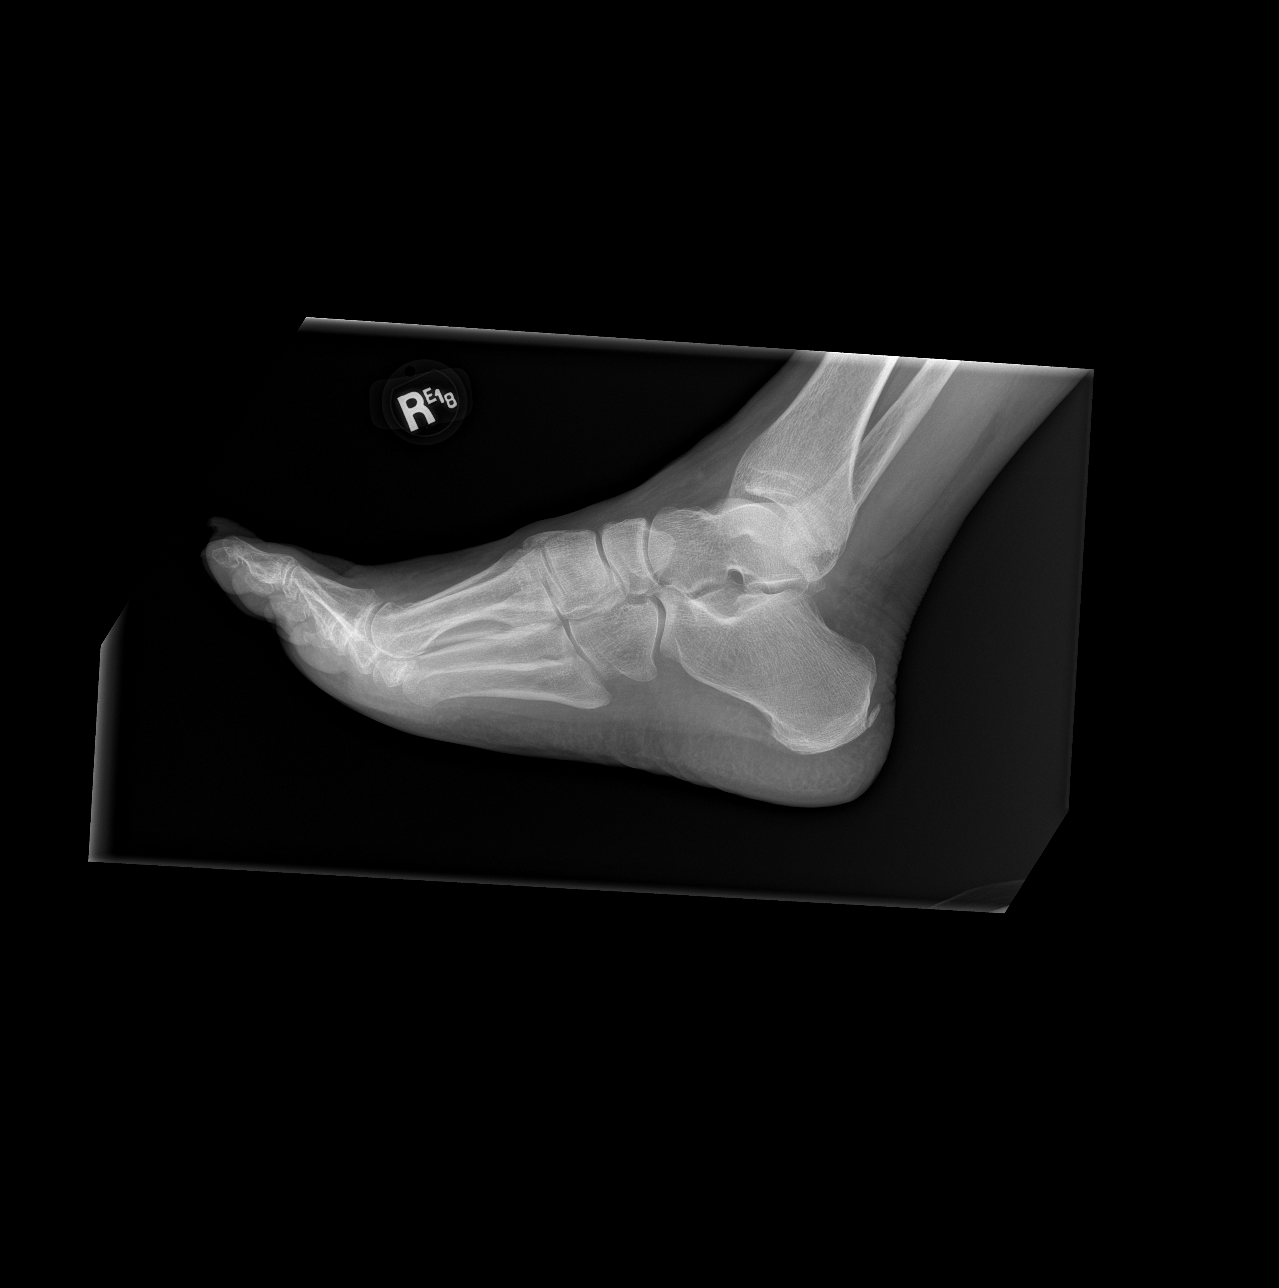

[3 of 3 positions shown; findings below may reference images not displayed]

FINDINGS: No fracture or dislocation of mid foot or forefoot. The phalanges
are normal. The calcaneus is normal. No soft tissue abnormality.

No osseous erosion of the toes.  Ulceration to
IMPRESSION: No acute osseous abnormality.

## 2021-12-30 NOTE — ED Provider Triage Note (Signed)
Emergency Medicine Provider Triage Evaluation Note  Ryan Mccarty , a 71 y.o. male  was evaluated in triage.  Pt complains of pain in his right foot/toes with discoloration to the toes. Onset 1+ month ago, wife first saw the toes today and called PCP who saw the patient and sent him to the ER for "blue toe syndrome."  Review of Systems  Positive: Foot pain Negative: Trauma, numbness  Physical Exam  BP (!) 152/88 (BP Location: Right Arm)    Pulse 89    Temp 98.1 F (36.7 C) (Oral)    Resp 18    SpO2 98%  Gen:   Awake, no distress   Resp:  Normal effort  MSK:   Moves extremities without difficulty  Other:  Sensation intact to toes, DP pulse present     Medical Decision Making  Medically screening exam initiated at 4:23 PM.  Appropriate orders placed.  Waldron Session was informed that the remainder of the evaluation will be completed by another provider, this initial triage assessment does not replace that evaluation, and the importance of remaining in the ED until their evaluation is complete.     Tacy Learn, PA-C 12/30/21 1633

## 2021-12-30 NOTE — ED Triage Notes (Addendum)
Patient was sent to the ED for blue toe syndrome on the right great toe. Patient will not answer when asked when it started.

## 2021-12-31 ENCOUNTER — Emergency Department (HOSPITAL_COMMUNITY)
Admission: EM | Admit: 2021-12-31 | Discharge: 2021-12-31 | Disposition: A | Discharge status: Home / Self Care | Payer: Medicare HMO | Source: Home / Self Care | Attending: Emergency Medicine | Admitting: Emergency Medicine

## 2021-12-31 ENCOUNTER — Emergency Department (HOSPITAL_BASED_OUTPATIENT_CLINIC_OR_DEPARTMENT_OTHER): Payer: Medicare HMO

## 2021-12-31 ENCOUNTER — Encounter (HOSPITAL_COMMUNITY): Payer: Self-pay | Admitting: *Deleted

## 2021-12-31 DIAGNOSIS — I129 Hypertensive chronic kidney disease with stage 1 through stage 4 chronic kidney disease, or unspecified chronic kidney disease: Secondary | ICD-10-CM | POA: Insufficient documentation

## 2021-12-31 DIAGNOSIS — Z7982 Long term (current) use of aspirin: Secondary | ICD-10-CM

## 2021-12-31 DIAGNOSIS — E1122 Type 2 diabetes mellitus with diabetic chronic kidney disease: Secondary | ICD-10-CM | POA: Insufficient documentation

## 2021-12-31 DIAGNOSIS — M79604 Pain in right leg: Secondary | ICD-10-CM | POA: Insufficient documentation

## 2021-12-31 DIAGNOSIS — Z79899 Other long term (current) drug therapy: Secondary | ICD-10-CM | POA: Insufficient documentation

## 2021-12-31 DIAGNOSIS — M79674 Pain in right toe(s): Secondary | ICD-10-CM | POA: Diagnosis not present

## 2021-12-31 DIAGNOSIS — D72829 Elevated white blood cell count, unspecified: Secondary | ICD-10-CM | POA: Insufficient documentation

## 2021-12-31 DIAGNOSIS — Z7984 Long term (current) use of oral hypoglycemic drugs: Secondary | ICD-10-CM

## 2021-12-31 DIAGNOSIS — F17210 Nicotine dependence, cigarettes, uncomplicated: Secondary | ICD-10-CM

## 2021-12-31 DIAGNOSIS — M79676 Pain in unspecified toe(s): Secondary | ICD-10-CM

## 2021-12-31 DIAGNOSIS — L819 Disorder of pigmentation, unspecified: Secondary | ICD-10-CM

## 2021-12-31 DIAGNOSIS — I739 Peripheral vascular disease, unspecified: Secondary | ICD-10-CM | POA: Insufficient documentation

## 2021-12-31 DIAGNOSIS — N183 Chronic kidney disease, stage 3 unspecified: Secondary | ICD-10-CM | POA: Insufficient documentation

## 2021-12-31 DIAGNOSIS — E119 Type 2 diabetes mellitus without complications: Secondary | ICD-10-CM

## 2021-12-31 DIAGNOSIS — I75021 Atheroembolism of right lower extremity: Secondary | ICD-10-CM

## 2021-12-31 LAB — BASIC METABOLIC PANEL
Anion gap: 8 (ref 5–15)
BUN: 22 mg/dL (ref 8–23)
CO2: 24 mmol/L (ref 22–32)
Calcium: 9.3 mg/dL (ref 8.9–10.3)
Chloride: 105 mmol/L (ref 98–111)
Creatinine, Ser: 1.54 mg/dL — ABNORMAL HIGH (ref 0.61–1.24)
GFR, Estimated: 49 mL/min — ABNORMAL LOW (ref >60)
Glucose, Bld: 186 mg/dL — ABNORMAL HIGH (ref 70–99)
Potassium: 4.4 mmol/L (ref 3.5–5.1)
Sodium: 137 mmol/L (ref 135–145)

## 2021-12-31 LAB — CBC
HCT: 46.6 % (ref 39.0–52.0)
Hemoglobin: 15.5 g/dL (ref 13.0–17.0)
MCH: 30.8 pg (ref 26.0–34.0)
MCHC: 33.3 g/dL (ref 30.0–36.0)
MCV: 92.6 fL (ref 80.0–100.0)
Platelets: 590 10*3/uL — ABNORMAL HIGH (ref 150–400)
RBC: 5.03 MIL/uL (ref 4.22–5.81)
RDW: 15.6 % — ABNORMAL HIGH (ref 11.5–15.5)
WBC: 11.8 10*3/uL — ABNORMAL HIGH (ref 4.0–10.5)
nRBC: 0 % (ref 0.0–0.2)

## 2021-12-31 LAB — PROTIME-INR
INR: 1 (ref 0.8–1.2)
Prothrombin Time: 13.5 seconds (ref 11.4–15.2)

## 2021-12-31 MED ORDER — ASPIRIN 81 MG PO CHEW
81.0000 mg | CHEWABLE_TABLET | Freq: Every day | ORAL | 0 refills | Status: AC
Start: 2021-12-31 — End: ?

## 2021-12-31 NOTE — Consult Note (Signed)
[facility] [place]  ASSESSMENT / PLAN: 71 y.o. 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 Reassuring exam and non-invasive studies. Pain is mild and seems to be controllable as an outpatient. Will need workup including echocardiogram and CT angiogram of the chest / abdomen / pelvis.   Recommend the following which can slow the progression of atherosclerosis and reduce the risk of major adverse cardiac / limb events:  Complete cessation from all tobacco products. Blood glucose control with goal A1c < 7%. Blood pressure control with goal blood pressure < 140/90 mmHg. Lipid reduction therapy with goal LDL-C <100 mg/dL (<70 if symptomatic from PAD).  Aspirin 81mg  PO QD.  Atorvastatin 40-80mg  PO QD (or other "high intensity" statin therapy).  PRN pain control.  Follow up with me in 1 month after echo / CT angiogram have been completed.   CHIEF COMPLAINT: right toe pain  HISTORY OF PRESENT ILLNESS: Ryan Mccarty is a 71 y.o. male who presents to the ER today for evaluation of a greater than 1 month history of right great toe pain.  He reports the pain has been getting worse over the last 2 weeks.  He was seen by his primary care physician and referred to the emergency room yesterday but left before being seen.  He returns today for evaluation.  He has noticed bluish discoloration of his right great toe.  He has some discoloration of all his toes.  The right great toe is the only 1 that seems to bother him.  He is a tangential historian, and gives copious details that are seemingly unrelated to his current complaint.  Past Medical History:  Diagnosis Date   Diabetes mellitus without complication ([place])   History of stroke  Past Surgical History:  Procedure Laterality Date   CYSTECTOMY     TONSILLECTOMY      Family History  Problem Relation Age of Onset   Cancer Father     Social History   Socioeconomic History   Marital status: Married    Spouse  name: Not on file   Number of children: Not on file   Years of education: Not on file   Highest education level: Not on file  Occupational History   Not on file  Tobacco Use   Smoking status: Every Day    Packs/day: 0.50    Types: Cigarettes   Smokeless tobacco: Never  Vaping Use   Vaping Use: Never used  Substance and Sexual Activity   Alcohol use: Never   Drug use: Never   Sexual activity: Not on file  Other Topics Concern   Not on file  Social History Narrative   Not on file   Social Determinants of Health   Financial Resource Strain: Not on file  Food Insecurity: Not on file  Transportation Needs: Not on file  Physical Activity: Not on file  Stress: Not on file  Social Connections: Not on file  Intimate Partner Violence: Not on file    No Known Allergies  No current facility-administered medications for this encounter.   Current Outpatient Medications  Medication Sig Dispense Refill   aspirin 81 MG chewable tablet Chew 1 tablet (81 mg total) by mouth daily. 30 tablet 0   atorvastatin (LIPITOR) 20 MG tablet Take by mouth.     budesonide-formoterol (SYMBICORT) 160-4.5 MCG/ACT inhaler Inhale into the lungs.     glipiZIDE (GLUCOTROL) 5 MG tablet Take 5 mg by mouth daily.     metoprolol succinate (TOPROL-XL)  25 MG 24 hr tablet Take by mouth.     midodrine (PROAMATINE) 5 MG tablet midodrine 5 mg tablet     pyridostigmine (MESTINON) 60 MG tablet pyridostigmine bromide 60 mg tablet      REVIEW OF SYSTEMS:  [X]  denotes positive finding, [ ]  denotes negative finding Cardiac  Comments:  Chest pain or chest pressure:    Shortness of breath upon exertion:    Short of breath when lying flat:    Irregular heart rhythm:        Vascular    Pain in calf, thigh, or hip brought on by ambulation:    Pain in feet at night that wakes you up from your sleep:     Blood clot in your veins:    Leg swelling:         Pulmonary    Oxygen at home:    Productive cough:      Wheezing:         Neurologic    Sudden weakness in arms or legs:     Sudden numbness in arms or legs:     Sudden onset of difficulty speaking or slurred speech:    Temporary loss of vision in one eye:     Problems with dizziness:         Gastrointestinal    Blood in stool:     Vomited blood:         Genitourinary    Burning when urinating:     Blood in urine:        Psychiatric    Major depression:         Hematologic    Bleeding problems:    Problems with blood clotting too easily:        Skin    Rashes or ulcers:        Constitutional    Fever or chills:      PHYSICAL EXAM Vitals:   12/31/21 1353 12/31/21 1515 12/31/21 1530 12/31/21 1603  BP: (!) 154/86 (!) 162/92 (!) 165/89   Pulse: 69 79 71   Resp: 16   16  Temp:    97.8 F (36.6 C)  TempSrc:    Oral  SpO2: 97% 99% 98%   Weight:      Height:        Constitutional: Chronically ill appearing. no distress. Appears well nourished.  Neurologic: Some difficulty with expression and word finding. CN intact. no focal findings. no sensory loss. Psychiatric:  Mood and affect symmetric and appropriate. Eyes:  No icterus. No conjunctival pallor. Ears, nose, throat:  mucous membranes moist. Midline trachea.  Cardiac: regular rate and rhythm.  Respiratory:  unlabored. Abdominal:  soft, non-tender, non-distended.  Peripheral vascular: 2+ DP pulses bilaterally Extremity: no edema. no cyanosis. no pallor.  Skin:    Lymphatic: no Stemmer's sign. no palpable lymphadenopathy.  PERTINENT LABORATORY AND RADIOLOGIC DATA  Most recent CBC CBC Latest Ref Rng & Units 12/31/2021 12/30/2021  WBC 4.0 - 10.5 K/uL 11.8(H) 13.9(H)  Hemoglobin 13.0 - 17.0 g/dL 15.5 16.0  Hematocrit 39.0 - 52.0 % 46.6 47.7  Platelets 150 - 400 K/uL 590(H) 609(H)     Most recent CMP CMP Latest Ref Rng & Units 12/31/2021 12/30/2021 12/21/2021  Glucose 70 - 99 mg/dL 186(H) 85 -  BUN 8 - 23 mg/dL 22 22 -  Creatinine 0.61 - 1.24 mg/dL 1.54(H) 1.43(H)  1.50(H)  Sodium 135 - 145 mmol/L 137 138 -  Potassium 3.5 - 5.1 mmol/L  4.4 4.0 -  Chloride 98 - 111 mmol/L 105 105 -  CO2 22 - 32 mmol/L 24 25 -  Calcium 8.9 - 10.3 mg/dL 9.3 9.8 -  Total Protein 6.5 - 8.1 g/dL - 7.1 -  Total Bilirubin 0.3 - 1.2 mg/dL - 0.7 -  Alkaline Phos 38 - 126 U/L - 56 -  AST 15 - 41 U/L - 15 -  ALT 0 - 44 U/L - 17 -    Renal function Estimated Creatinine Clearance: 48.9 mL/min (A) (by C-G formula based on SCr of 1.54 mg/dL (H)).  +-------+-----------+-----------+------------+------------+   ABI/TBI Today's ABI Today's TBI Previous ABI Previous TBI   +-------+-----------+-----------+------------+------------+   Right   1.12        0.28                                    +-------+-----------+-----------+------------+------------+   Left    0.95        0.77                                    +-------+-----------+-----------+------------+------------+   Ryan Mccarty. Stanford Breed, MD [facility] Phone Number: [phone] 12/31/2021 4:56 PM  Total time spent on preparing this encounter including chart review, data review, collecting history, examining the patient, coordinating care for this new patient, 60 minutes.  Portions of this report may have been transcribed using voice recognition software.  Every effort has been made to ensure accuracy; however, inadvertent computerized transcription errors may still be present.

## 2021-12-31 NOTE — ED Provider Notes (Incomplete Revision)
[facility] DEPT Provider Note   CSN: [id number] Arrival date & time: 12/31/21  1002     History  Chief Complaint  Patient presents with   Toe Pain    Ryan Mccarty is a 71 y.o. male.  Presented to the emergency room with concern for toe pain and discoloration.  States that he noticed some pain about a couple months ago and then started having some discoloration.  Only first disclosed his findings to family yesterday.  This prompted a visit to his primary care office who recommended patient go to ER for vascular evaluation due to concern for possible limb ischemia.  Patient states the pain in discoloration has been relatively stable over the last couple weeks, no significant change today.  Pain is primarily in the right great toe.  He denies any pain in his lower legs or thigh regions bilaterally.  Has history of diabetes, high blood pressure.  Completed chart review, reviewed last nephrology note, has history of stage III kidney disease, type 2 diabetes, hypertension, hyperlipidemia.  HPI     Home Medications Prior to Admission medications   Medication Sig Start Date End Date Taking? Authorizing Provider  atorvastatin (LIPITOR) 20 MG tablet Take by mouth. 12/27/18   [provider]  budesonide-formoterol (SYMBICORT) 160-4.5 MCG/ACT inhaler Inhale into the lungs.    [provider]  glipiZIDE (GLUCOTROL) 5 MG tablet Take 5 mg by mouth daily. 12/30/21   [provider]  metoprolol succinate (TOPROL-XL) 25 MG 24 hr tablet Take by mouth. 12/29/19   [provider]  midodrine (PROAMATINE) 5 MG tablet midodrine 5 mg tablet    [provider]  pyridostigmine (MESTINON) 60 MG tablet pyridostigmine bromide 60 mg tablet    [provider]  warfarin (COUMADIN) 5 MG tablet Take by mouth.    [provider]      Allergies    Patient has no known allergies.    Review of Systems   Review of  Systems  Constitutional:  Negative for chills, fatigue and fever.  HENT:  Negative for ear pain and sore throat.   Eyes:  Negative for pain and visual disturbance.  Respiratory:  Negative for cough and shortness of breath.   Cardiovascular:  Negative for chest pain and palpitations.  Gastrointestinal:  Negative for abdominal pain and vomiting.  Genitourinary:  Negative for dysuria and hematuria.  Musculoskeletal:  Positive for arthralgias and myalgias. Negative for back pain.  Skin:  Negative for color change and rash.  Neurological:  Negative for seizures and syncope.  All other systems reviewed and are negative.  Physical Exam Updated Vital Signs BP (!) 154/86    Pulse 69    Temp 98 F (36.7 C) (Oral)    Resp 16    Ht 5\' 11"  (1.803 m)    Wt 88 kg    SpO2 97%    BMI 27.06 kg/m  Physical Exam Vitals and nursing note reviewed.  Constitutional:      General: He is not in acute distress.    Appearance: He is well-developed.  HENT:     Head: Normocephalic and atraumatic.  Eyes:     Conjunctiva/sclera: Conjunctivae normal.  Cardiovascular:     Rate and Rhythm: Normal rate and regular rhythm.     Heart sounds: No murmur heard. Pulmonary:     Effort: Pulmonary effort is normal. No respiratory distress.     Breath sounds: Normal breath sounds.  Abdominal:  Palpations: Abdomen is soft.     Tenderness: There is no abdominal tenderness.  Musculoskeletal:        General: No swelling.     Cervical back: Neck supple.     Comments: Left lower extremity: Entire leg is warm and feels well-perfused, DP and PT pulses intact, no significant erythema or purplish discoloration appreciated Right lower extremity: Thigh, lower leg are warm, well-perfused, foot also feels warm and well-perfused, DP and PT pulses are intact, equal, purplish, delayed cap refill to entirety of great toe  Skin:    General: Skin is warm and dry.     Capillary Refill: Capillary refill takes less than 2 seconds.   Neurological:     Mental Status: He is alert.  Psychiatric:        Mood and Affect: Mood normal.     Media Information Document Information  Photos    12/30/2021 16:27  Attached To:  Hospital Encounter on 12/30/21   Source Information  [facility] Lias   [facility]       ED Results / Procedures / Treatments   Labs (all labs ordered are listed, but only abnormal results are displayed) Labs Reviewed  CBC - Abnormal; Notable for the following components:      Result Value   WBC 11.8 (*)    RDW 15.6 (*)    Platelets 590 (*)    All other components within normal limits  BASIC METABOLIC PANEL - Abnormal; Notable for the following components:   Glucose, Bld 186 (*)    Creatinine, Ser 1.54 (*)    GFR, Estimated 49 (*)    All other components within normal limits  PROTIME-INR    EKG None  Radiology DG Foot Complete Right  Result Date: 12/30/2021 CLINICAL DATA:  Discolored toes EXAM: RIGHT FOOT COMPLETE - 3+ VIEW COMPARISON:  None. FINDINGS: No fracture or dislocation of mid foot or forefoot. The phalanges are normal. The calcaneus is normal. No soft tissue abnormality. No osseous erosion of the toes.  Ulceration to IMPRESSION: No acute osseous abnormality. Electronically Signed   By: Suzy Bouchard M.D.   On: 12/30/2021 17:09   VAS [place] ABI WITH/WO TBI  Result Date: 12/31/2021  LOWER EXTREMITY DOPPLER STUDY Patient Name:  Ryan Mccarty  Date of Exam:   12/31/2021 Medical Rec #: [id number]          Accession #:    [id number] Date of Birth: [birth year]           Patient Gender: M Patient Age:   37 years Exam Location:  [facility] Procedure:      VAS [place] ABI WITH/WO TBI Referring Phys: AMJAD ALI --------------------------------------------------------------------------------  Indications: Discoloration and pain to right great toe. High Risk Factors: Hypertension, Diabetes, current smoker.  Comparison Study: No prior studies. Performing Technologist: Darlin Coco RDMS, RVT  Examination Guidelines: A complete evaluation includes at minimum, Doppler waveform signals and systolic blood pressure reading at the level of bilateral brachial, anterior tibial, and posterior tibial arteries, when vessel segments are accessible. Bilateral testing is considered an integral part of a complete examination. Photoelectric Plethysmograph (PPG) waveforms and toe systolic pressure readings are included as required and additional duplex testing as needed. Limited examinations for reoccurring indications may be performed as noted.  ABI Findings: +---------+------------------+-----+---------+--------+  Right     Rt Pressure (mmHg) Index Waveform  Comment   +---------+------------------+-----+---------+--------+  Brachial  184  triphasic           +---------+------------------+-----+---------+--------+  PTA       206                1.12  triphasic           +---------+------------------+-----+---------+--------+  DP        184                1.00  triphasic           +---------+------------------+-----+---------+--------+  Great Toe 51                 0.28  Abnormal            +---------+------------------+-----+---------+--------+ +---------+------------------+-----+---------+-------+  Left      Lt Pressure (mmHg) Index Waveform  Comment  +---------+------------------+-----+---------+-------+  Brachial  180                      triphasic          +---------+------------------+-----+---------+-------+  PTA       166                0.90  triphasic          +---------+------------------+-----+---------+-------+  DP        174                0.95  triphasic          +---------+------------------+-----+---------+-------+  Great Toe 141                0.77  Normal             +---------+------------------+-----+---------+-------+ +-------+-----------+-----------+------------+------------+  ABI/TBI Today's ABI Today's TBI Previous ABI Previous TBI   +-------+-----------+-----------+------------+------------+  Right   1.12        0.28                                   +-------+-----------+-----------+------------+------------+  Left    0.95        0.77                                   +-------+-----------+-----------+------------+------------+  Summary: Right: Resting right ankle-brachial index is within normal range. No evidence of significant right lower extremity arterial disease. The right toe-brachial index is abnormal. Right great toe pressure = 51 mmHg. Left: Resting left ankle-brachial index is within normal range. No evidence of significant left lower extremity arterial disease. The left toe-brachial index is normal.  *See table(s) above for measurements and observations.     Preliminary     Procedures Procedures    Medications Ordered in ED Medications - No data to display  ED Course/ Medical Decision Making/ A&P                           Medical Decision Making  71 year old male with pain, discoloration to his toes in right foot.  On exam patient otherwise well-appearing in no acute distress, noted delayed cap refill, cool, purplish appearance to his great toe and right foot.  Basic labs grossly within normal limits.  Reviewed CBC, BMP, slight leukocytosis. Additional hx obtained from chart review, reviewed PCP, nephrology notes. No mention of prior PVD/PAD but patient has multiple medical comorbidities raising risks for PAD, limb ischemia.  Concern for poor perfusion, PAD, PVD.  DP and PT pulses intact bilaterally.  Obtained ABI of lower extremities to evaluate further. ABI bilaterally is within normal limits.  TBI (toe brachial index) was significantly abnormal, 0.28.  Discussed case with Dr. Stanford Breed with vascular surgery who will come evaluate patient.   While awaiting evaluation by vascular surgery, will sign out to Dr. Sherry Ruffing who will follow-up on recommendations from Dr. Stanford Breed.         Final Clinical Impression(s) / ED  Diagnoses Final diagnoses:  Peripheral arterial disease ([place])    Rx / DC Orders ED Discharge Orders     None         Lucrezia Starch, MD 12/31/21 1438

## 2021-12-31 NOTE — Progress Notes (Signed)
ABI w/ TBI study completed.  Preliminary results relayed to Roslynn Amble, MD at bedside.  See CV Proc for preliminary results report.   Darlin Coco, RDMS, RVT

## 2021-12-31 NOTE — ED Triage Notes (Signed)
Pt here yesterday but did not wait to be seen, Diabetic, last 3-4 weeks rt great toe hurting and turning purple, states other toes are also discolored.

## 2021-12-31 NOTE — Discharge Instructions (Signed)
The vascular surgery team saw you and feel you are safe for outpatient follow-up.  They also want you to start aspirin daily.  Please take this with her other medications and rest.  If any symptoms change or worsen acutely, please return to the nearest emergency department.

## 2021-12-31 NOTE — ED Provider Triage Note (Signed)
Emergency Medicine Provider Triage Evaluation Note  Ryan Mccarty , a 71 y.o. male  was evaluated in triage.  Pt complains of toe pain and discoloration of 1 month duration of his progressively worsening over the past 2 weeks.  Patient was evaluated by PCP yesterday and referred to emergency room.  Patient initially presented to [facility] and left prior to being roomed.  Patient endorses balance issues but these are chronic and follows with neurology.  Denies fever, chills.   Review of Systems  Positive: As above Negative: As above  Physical Exam  BP (!) 156/95 (BP Location: Right Arm)    Pulse 83    Temp 98 F (36.7 C) (Oral)    Resp 16    Ht 5\' 11"  (1.803 m)    Wt 88 kg    SpO2 98%    BMI 27.06 kg/m  Gen:   Awake, no distress   Resp:  Normal effort  MSK:   Moves extremities without difficulty  Other:  2+ DP pulse present on the right.  Discoloration noted all toes on right foot predominantly of the great toe.  Tenderness to palpation present over the first MTP joint.  Medical Decision Making  Medically screening exam initiated at 10:57 AM.  Appropriate orders placed.  Waldron Session was informed that the remainder of the evaluation will be completed by another provider, this initial triage assessment does not replace that evaluation, and the importance of remaining in the ED until their evaluation is complete.     Evlyn Courier, PA-C 12/31/21 1100

## 2021-12-31 NOTE — ED Provider Notes (Signed)
3:16 PM Care assumed from Dr. Roslynn Amble.  At time of transfer of care, patient is waiting for evaluation and recommendations by vascular surgery for the suspected ischemic toe.  Anticipate following their recommendations.  3:46 PM Vascular surgery came to see the patient and feel he is appropriate for outpatient follow-up but they do want to initiate baby aspirin daily.  Will order a prescription for him and he will be discharged to follow-up with vascular surgery.  Clinical Impression: 1. Peripheral arterial disease (HCC)   2. Pain of great toe, unspecified laterality     Disposition: Discharge  Condition: Good  I have discussed the results, Dx and Tx plan with the pt(& family if present). He/she/they expressed understanding and agree(s) with the plan. Discharge instructions discussed at great length. Strict return precautions discussed and pt &/or family have verbalized understanding of the instructions. No further questions at time of discharge.    New Prescriptions   ASPIRIN 81 MG CHEWABLE TABLET    Chew 1 tablet (81 mg total) by mouth daily.    Follow Up: Cherre Robins, MD [place] [id number] [place] DEPT [place] [phone] [place]       Salil Raineri, Gwenyth Allegra, MD 12/31/21 [phone]

## 2021-12-31 NOTE — ED Provider Notes (Addendum)
[facility] DEPT Provider Note   CSN: [id number] Arrival date & time: 12/31/21  1002     History  Chief Complaint  Patient presents with   Toe Pain    Ryan Mccarty is a 71 y.o. male.  Presented to the emergency room with concern for toe pain and discoloration.  States that he noticed some pain about a couple months ago and then started having some discoloration.  Only first disclosed his findings to family yesterday.  This prompted a visit to his primary care office who recommended patient go to ER for vascular evaluation due to concern for possible limb ischemia.  Patient states the pain in discoloration has been relatively stable over the last couple weeks, no significant change today.  Pain is primarily in the right great toe.  He denies any pain in his lower legs or thigh regions bilaterally.  Has history of diabetes, high blood pressure.  Completed chart review, reviewed last nephrology note, has history of stage III kidney disease, type 2 diabetes, hypertension, hyperlipidemia.  HPI     Home Medications Prior to Admission medications   Medication Sig Start Date End Date Taking? Authorizing Provider  aspirin 81 MG chewable tablet Chew 1 tablet (81 mg total) by mouth daily. 12/31/21  Yes Tegeler, Gwenyth Allegra, MD  atorvastatin (LIPITOR) 20 MG tablet Take by mouth. 12/27/18   [provider]  budesonide-formoterol (SYMBICORT) 160-4.5 MCG/ACT inhaler Inhale into the lungs.    [provider]  glipiZIDE (GLUCOTROL) 5 MG tablet Take 5 mg by mouth daily. 12/30/21   [provider]  metoprolol succinate (TOPROL-XL) 25 MG 24 hr tablet Take by mouth. 12/29/19   [provider]  midodrine (PROAMATINE) 5 MG tablet midodrine 5 mg tablet    [provider]  pyridostigmine (MESTINON) 60 MG tablet pyridostigmine bromide 60 mg tablet    [provider]      Allergies    Patient has no known allergies.     Review of Systems   Review of Systems  Constitutional:  Negative for chills, fatigue and fever.  HENT:  Negative for ear pain and sore throat.   Eyes:  Negative for pain and visual disturbance.  Respiratory:  Negative for cough and shortness of breath.   Cardiovascular:  Negative for chest pain and palpitations.  Gastrointestinal:  Negative for abdominal pain and vomiting.  Genitourinary:  Negative for dysuria and hematuria.  Musculoskeletal:  Positive for arthralgias and myalgias. Negative for back pain.  Skin:  Negative for color change and rash.  Neurological:  Negative for seizures and syncope.  All other systems reviewed and are negative.  Physical Exam Updated Vital Signs BP (!) 165/89    Pulse 71    Temp 97.8 F (36.6 C) (Oral)    Resp 16    Ht 5\' 11"  (1.803 m)    Wt 88 kg    SpO2 98%    BMI 27.06 kg/m  Physical Exam Vitals and nursing note reviewed.  Constitutional:      General: He is not in acute distress.    Appearance: He is well-developed.  HENT:     Head: Normocephalic and atraumatic.  Eyes:     Conjunctiva/sclera: Conjunctivae normal.  Cardiovascular:     Rate and Rhythm: Normal rate and regular rhythm.     Heart sounds: No murmur heard. Pulmonary:     Effort: Pulmonary effort is normal. No respiratory distress.     Breath sounds: Normal breath  sounds.  Abdominal:     Palpations: Abdomen is soft.     Tenderness: There is no abdominal tenderness.  Musculoskeletal:        General: No swelling.     Cervical back: Neck supple.     Comments: Left lower extremity: Entire leg is warm and feels well-perfused, DP and PT pulses intact, no significant erythema or purplish discoloration appreciated Right lower extremity: Thigh, lower leg are warm, well-perfused, foot also feels warm and well-perfused, DP and PT pulses are intact, equal, purplish, delayed cap refill to entirety of great toe  Skin:    General: Skin is warm and dry.     Capillary Refill: Capillary  refill takes less than 2 seconds.  Neurological:     Mental Status: He is alert.  Psychiatric:        Mood and Affect: Mood normal.     Media Information Document Information  Photos    12/30/2021 16:27  Attached To:  Hospital Encounter on 12/30/21   Source Information  Roque Lias   [facility]       ED Results / Procedures / Treatments   Labs (all labs ordered are listed, but only abnormal results are displayed) Labs Reviewed  CBC - Abnormal; Notable for the following components:      Result Value   WBC 11.8 (*)    RDW 15.6 (*)    Platelets 590 (*)    All other components within normal limits  BASIC METABOLIC PANEL - Abnormal; Notable for the following components:   Glucose, Bld 186 (*)    Creatinine, Ser 1.54 (*)    GFR, Estimated 49 (*)    All other components within normal limits  PROTIME-INR    EKG None  Radiology DG Foot Complete Right  Result Date: 12/30/2021 CLINICAL DATA:  Discolored toes EXAM: RIGHT FOOT COMPLETE - 3+ VIEW COMPARISON:  None. FINDINGS: No fracture or dislocation of mid foot or forefoot. The phalanges are normal. The calcaneus is normal. No soft tissue abnormality. No osseous erosion of the toes.  Ulceration to IMPRESSION: No acute osseous abnormality. Electronically Signed   By: Suzy Bouchard M.D.   On: 12/30/2021 17:09   VAS [place] ABI WITH/WO TBI  Result Date: 12/31/2021  LOWER EXTREMITY DOPPLER STUDY Patient Name:  Ryan Mccarty  Date of Exam:   12/31/2021 Medical Rec #: [id number]          Accession #:    [id number] Date of Birth: [birth year]           Patient Gender: M Patient Age:   71 years Exam Location:  [facility] Procedure:      VAS [place] ABI WITH/WO TBI Referring Phys: AMJAD ALI --------------------------------------------------------------------------------  Indications: Discoloration and pain to right great toe. High Risk Factors: Hypertension, Diabetes, current smoker.  Comparison Study: No prior  studies. Performing Technologist: Darlin Coco RDMS, RVT  Examination Guidelines: A complete evaluation includes at minimum, Doppler waveform signals and systolic blood pressure reading at the level of bilateral brachial, anterior tibial, and posterior tibial arteries, when vessel segments are accessible. Bilateral testing is considered an integral part of a complete examination. Photoelectric Plethysmograph (PPG) waveforms and toe systolic pressure readings are included as required and additional duplex testing as needed. Limited examinations for reoccurring indications may be performed as noted.  ABI Findings: +---------+------------------+-----+---------+--------+  Right     Rt Pressure (mmHg) Index Waveform  Comment   +---------+------------------+-----+---------+--------+  Brachial  184  triphasic           +---------+------------------+-----+---------+--------+  PTA       206                1.12  triphasic           +---------+------------------+-----+---------+--------+  DP        184                1.00  triphasic           +---------+------------------+-----+---------+--------+  Great Toe 51                 0.28  Abnormal            +---------+------------------+-----+---------+--------+ +---------+------------------+-----+---------+-------+  Left      Lt Pressure (mmHg) Index Waveform  Comment  +---------+------------------+-----+---------+-------+  Brachial  180                      triphasic          +---------+------------------+-----+---------+-------+  PTA       166                0.90  triphasic          +---------+------------------+-----+---------+-------+  DP        174                0.95  triphasic          +---------+------------------+-----+---------+-------+  Great Toe 141                0.77  Normal             +---------+------------------+-----+---------+-------+ +-------+-----------+-----------+------------+------------+  ABI/TBI Today's ABI Today's TBI Previous  ABI Previous TBI  +-------+-----------+-----------+------------+------------+  Right   1.12        0.28                                   +-------+-----------+-----------+------------+------------+  Left    0.95        0.77                                   +-------+-----------+-----------+------------+------------+  Summary: Right: Resting right ankle-brachial index is within normal range. No evidence of significant right lower extremity arterial disease. The right toe-brachial index is abnormal. Right great toe pressure = 51 mmHg. Left: Resting left ankle-brachial index is within normal range. No evidence of significant left lower extremity arterial disease. The left toe-brachial index is normal.  *See table(s) above for measurements and observations.  Electronically signed by Jamelle Haring on 12/31/2021 at 3:14:42 PM.    Final     Procedures Procedures    Medications Ordered in ED Medications - No data to display  ED Course/ Medical Decision Making/ A&P                           Medical Decision Making  71 year old male with pain, discoloration to his toes in right foot.  On exam patient otherwise well-appearing in no acute distress, noted delayed cap refill, cool, purplish appearance to his great toe and right foot.  Basic labs grossly within normal limits.  Reviewed CBC, BMP, slight leukocytosis. Additional hx obtained from chart review, reviewed PCP, nephrology notes. No mention of prior PVD/PAD but patient has  multiple medical comorbidities raising risks for PAD, limb ischemia.  Concern for poor perfusion, PAD, PVD.  DP and PT pulses intact bilaterally.  Obtained ABI of lower extremities to evaluate further. ABI bilaterally is within normal limits.  TBI (toe brachial index) was significantly abnormal, 0.28.  Discussed case with Dr. Stanford Breed with vascular surgery who will come evaluate patient.   While awaiting evaluation by vascular surgery, will sign out to Dr. Sherry Ruffing who will follow-up on  recommendations from Dr. Stanford Breed.   Final Clinical Impression(s) / ED Diagnoses Final diagnoses:  Peripheral arterial disease (HCC)  Pain of great toe, unspecified laterality    Rx / DC Orders ED Discharge Orders          Ordered    aspirin 81 MG chewable tablet  Daily        12/31/21 1552              Lucrezia Starch, MD 12/31/21 1438    Lucrezia Starch, MD 01/01/22 [phone]

## 2022-01-10 ENCOUNTER — Ambulatory Visit (HOSPITAL_COMMUNITY): Payer: Medicare HMO | Admitting: Physical Therapy

## 2022-01-11 DIAGNOSIS — R42 Dizziness and giddiness: Secondary | ICD-10-CM | POA: Diagnosis not present

## 2022-01-11 DIAGNOSIS — I6930 Unspecified sequelae of cerebral infarction: Secondary | ICD-10-CM | POA: Diagnosis not present

## 2022-01-11 DIAGNOSIS — R296 Repeated falls: Secondary | ICD-10-CM | POA: Diagnosis not present

## 2022-01-11 DIAGNOSIS — I951 Orthostatic hypotension: Secondary | ICD-10-CM | POA: Diagnosis not present

## 2022-01-11 DIAGNOSIS — E1143 Type 2 diabetes mellitus with diabetic autonomic (poly)neuropathy: Secondary | ICD-10-CM | POA: Diagnosis not present

## 2022-02-09 ENCOUNTER — Encounter (HOSPITAL_COMMUNITY): Payer: Self-pay | Admitting: Physical Therapy

## 2022-02-09 ENCOUNTER — Other Ambulatory Visit: Payer: Self-pay

## 2022-02-09 ENCOUNTER — Ambulatory Visit (HOSPITAL_COMMUNITY): Payer: Medicare HMO | Attending: Otolaryngology | Admitting: Physical Therapy

## 2022-02-09 DIAGNOSIS — Z0001 Encounter for general adult medical examination with abnormal findings: Secondary | ICD-10-CM | POA: Diagnosis not present

## 2022-02-09 DIAGNOSIS — R351 Nocturia: Secondary | ICD-10-CM | POA: Diagnosis not present

## 2022-02-09 DIAGNOSIS — Z8679 Personal history of other diseases of the circulatory system: Secondary | ICD-10-CM | POA: Diagnosis not present

## 2022-02-09 DIAGNOSIS — N400 Enlarged prostate without lower urinary tract symptoms: Secondary | ICD-10-CM | POA: Diagnosis not present

## 2022-02-09 DIAGNOSIS — R262 Difficulty in walking, not elsewhere classified: Secondary | ICD-10-CM | POA: Diagnosis not present

## 2022-02-09 DIAGNOSIS — Z23 Encounter for immunization: Secondary | ICD-10-CM | POA: Diagnosis not present

## 2022-02-09 DIAGNOSIS — R296 Repeated falls: Secondary | ICD-10-CM | POA: Diagnosis not present

## 2022-02-09 DIAGNOSIS — I739 Peripheral vascular disease, unspecified: Secondary | ICD-10-CM | POA: Diagnosis not present

## 2022-02-09 DIAGNOSIS — E119 Type 2 diabetes mellitus without complications: Secondary | ICD-10-CM | POA: Diagnosis not present

## 2022-02-09 DIAGNOSIS — R42 Dizziness and giddiness: Secondary | ICD-10-CM | POA: Insufficient documentation

## 2022-02-09 DIAGNOSIS — J449 Chronic obstructive pulmonary disease, unspecified: Secondary | ICD-10-CM | POA: Diagnosis not present

## 2022-02-09 NOTE — Therapy (Signed)
[facility] [address] [place], [place], [address] Phone: [phone]   Fax:  [phone]  Physical Therapy Evaluation  Patient Details  Name: Ryan Mccarty MRN: [id number] Date of Birth: 11/14/1951 Referring Provider (PT): Leta Baptist MD   Encounter Date: 02/09/2022   PT End of Session - 02/09/22 1512     Visit Number 1    Number of Visits 12    Date for PT Re-Evaluation 03/23/22    Authorization Type [employer] Medicare    Authorization Time Period check auth    Progress Note Due on Visit 10    PT Start Time 1432    PT Stop Time 1515    PT Time Calculation (min) 43 min    Equipment Utilized During Treatment Gait belt    Activity Tolerance Patient tolerated treatment well;Patient limited by fatigue    Behavior During Therapy WFL for tasks assessed/performed             Past Medical History:  Diagnosis Date   Diabetes mellitus without complication ([place])     Past Surgical History:  Procedure Laterality Date   CYSTECTOMY     TONSILLECTOMY      There were no vitals filed for this visit.    Subjective Assessment - 02/09/22 1446     Subjective Patient presents with insidious vertigo and gait disturbance that nobody knows anything about. It began a year ago. He has had MRI which showed he may have had a stroke. He has an appointment coming up with a neuro surgeon because his PCP suggested this might be something to do with my neck. He reports several falls but is not using an AD.    Limitations Walking;Standing;House hold activities    Diagnostic tests MRI    Patient Stated Goals Get back to normal    Currently in Pain? Yes    Pain Score 1     Pain Location --   Toe   Pain Orientation Right    Pain Descriptors / Indicators Burning;Sharp    Pain Type Acute pain    Pain Onset More than a month ago    Pain Frequency Constant    Aggravating Factors  WB    Pain Relieving Factors NWB    Effect of Pain on Daily Activities  Limits                [facility] PT Assessment - 02/09/22 0001       Assessment   Medical Diagnosis Dizziness    Referring Provider (PT) Leta Baptist MD    Prior Therapy No      Precautions   Precautions Fall      Restrictions   Weight Bearing Restrictions No      Balance Screen   Has the patient fallen in the past 6 months Yes    How many times? >20    Has the patient had a decrease in activity level because of a fear of falling?  Yes    Is the patient reluctant to leave their home because of a fear of falling?  Yes      [place] Private residence    Living Arrangements Spouse/significant other      Prior Function   Level of Independence Independent      Cognition   Overall Cognitive Status Within Functional Limits for tasks assessed      Observation/Other Assessments   Focus on Therapeutic Outcomes (FOTO)  54%  function      ROM / Strength   AROM / PROM / Strength Strength      Strength   Strength Assessment Site Hip;Knee;Ankle    Right/Left Hip Right;Left    Right Hip Flexion 5/5    Left Hip Flexion 5/5    Right/Left Knee Right;Left    Right Knee Extension 4/5    Left Knee Extension 4/5    Right/Left Ankle Right;Left    Right Ankle Dorsiflexion 4+/5    Left Ankle Dorsiflexion 4+/5      Ambulation/Gait   Ambulation/Gait Yes    Ambulation/Gait Assistance 4: Min assist    Assistive device None    Gait Pattern Decreased step length - right;Decreased step length - left;Decreased stride length;Decreased dorsiflexion - right;Decreased dorsiflexion - left;Shuffle    Ambulation Surface Level;Indoor      Standardized Balance Assessment   Standardized Balance Assessment Dynamic Gait Index      Dynamic Gait Index   Level Surface Moderate Impairment    Change in Gait Speed Moderate Impairment    Gait with Horizontal Head Turns Moderate Impairment    Gait with Vertical Head Turns Moderate Impairment    Gait and Pivot Turn Moderate  Impairment    Step Over Obstacle Severe Impairment    Step Around Obstacles Moderate Impairment    Steps Moderate Impairment    Total Score 7                        Objective measurements completed on examination: See above findings.                PT Education - 02/09/22 1450     Education Details on evaluation findings, POC and use of AD for safety with gait    Person(s) Educated Patient    Methods Explanation    Comprehension Verbalized understanding              PT Short Term Goals - 02/09/22 1515       PT SHORT TERM GOAL #1   Title Patient will be independent with initial HEP and self-management strategies to improve functional outcomes    Time 3    Period Weeks    Status New    Target Date 03/02/22      PT SHORT TERM GOAL #2   Title Patient will improve DGI by at least 5 points to demo improved balance and reduced risk for falls.    Time 3    Period Weeks    Status New    Target Date 03/02/22               PT Long Term Goals - 02/09/22 1516       PT LONG TERM GOAL #1   Title Patient will improve DGI by at least 10 points to demo improved balance and reduced risk for falls.    Time 6    Period Weeks    Status New    Target Date 03/23/22      PT LONG TERM GOAL #2   Title Patient will have equal to or > 4+/5 MMT throughout BLE to improve ability to perform functional mobility, stair ambulation and ADLs.    Time 6    Period Weeks    Status New    Target Date 03/23/22      PT LONG TERM GOAL #3   Title Patient will demo correct use of LRAD when ambulation for reduced risk  for future falls.    Time 6    Period Weeks    Status New    Target Date 03/23/22                    Plan - 02/09/22 1513     Clinical Impression Statement Patient is a 71 y.o. male who presents to physical therapy with complaint of dizziness and gait disturbance. Patient demonstrates decreased strength, balance deficits and gait  abnormalities which are negatively impacting patient ability to perform ADLs and functional mobility tasks. Patient will benefit from skilled physical therapy services to address these deficits to improve level of function with ADLs, functional mobility tasks, and reduce risk for falls.    Examination-Activity Limitations Locomotion Level;Stairs;Transfers;Stand    Examination-Participation Restrictions Management consultant;Shop    Stability/Clinical Decision Making Evolving/Moderate complexity    Clinical Decision Making Moderate    Rehab Potential Fair    PT Frequency 2x / week    PT Duration 6 weeks    PT Treatment/Interventions ADLs/Self Care Home Management;Aquatic Therapy;Iontophoresis 4mg /ml Dexamethasone;Moist Heat;Stair training;Gait training;DME Instruction;Traction;Canalith Repostioning;Ultrasound;Functional mobility training;Therapeutic activities;Parrafin;Fluidtherapy;Cryotherapy;Electrical Stimulation;Contrast Bath;Therapeutic exercise;Patient/family education;Orthotic Fit/Training;Manual lymph drainage;Compression bandaging;Splinting;Taping;Vasopneumatic Device;Joint Manipulations;Spinal Manipulations;Dry needling;Manual techniques;Neuromuscular re-education;Scar mobilization;Balance training;Vestibular;Passive range of motion;Visual/perceptual remediation/compensation;Energy conservation    PT Next Visit Plan Progress hip strength and balance as able. Gait training with AD    PT Home Exercise Plan Issue next visit    Consulted and Agree with Plan of Care Patient             Patient will benefit from skilled therapeutic intervention in order to improve the following deficits and impairments:  Abnormal gait, Pain, Improper body mechanics, Dizziness, Decreased knowledge of use of DME, Decreased coordination, Decreased mobility, Decreased endurance, Decreased strength, Impaired perceived functional ability, Decreased balance, Difficulty walking  Visit  Diagnosis: Dizziness and giddiness  Difficulty in walking, not elsewhere classified  Repeated falls     Problem List Patient Active Problem List   Diagnosis Date Noted   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 [place], [place], [address] Phone: [phone]   Fax:  [phone]  Name: Ryan Mccarty MRN: [id number] Date of Birth: 1950/12/28

## 2022-02-10 DIAGNOSIS — E559 Vitamin D deficiency, unspecified: Secondary | ICD-10-CM | POA: Diagnosis not present

## 2022-02-10 DIAGNOSIS — E119 Type 2 diabetes mellitus without complications: Secondary | ICD-10-CM | POA: Diagnosis not present

## 2022-02-10 DIAGNOSIS — Z13220 Encounter for screening for lipoid disorders: Secondary | ICD-10-CM | POA: Diagnosis not present

## 2022-02-13 DIAGNOSIS — M47812 Spondylosis without myelopathy or radiculopathy, cervical region: Secondary | ICD-10-CM | POA: Diagnosis not present

## 2022-02-13 NOTE — Progress Notes (Signed)
[facility]   ASSESSMENT / PLAN: 71 y.o. 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    Recommend the following which can slow the progression of atherosclerosis and reduce the risk of major adverse cardiac / limb events:  Complete cessation from all tobacco products. Blood glucose control with goal A1c < 7%. Blood pressure control with goal blood pressure < 140/90 mmHg. Lipid reduction therapy with goal LDL-C <100 mg/dL (<70 if symptomatic from PAD).  Aspirin 81mg  PO QD.  Atorvastatin 40-80mg  PO QD (or other "high intensity" statin therapy).   Will need workup including echocardiogram and CT of the chest without contrast given his renal insufficiency.  I will call him with the results.  CHIEF COMPLAINT: right toe pain   HISTORY OF PRESENT ILLNESS: Ryan Mccarty is a 71 y.o. male who presents to the ER today for evaluation of a greater than 1 month history of right great toe pain.  He reports the pain has been getting worse over the last 2 weeks.  He was seen by his primary care physician and referred to the emergency room yesterday but left before being seen.  He returns today for evaluation.  He has noticed bluish discoloration of his right great toe.  He has some discoloration of all his toes.  The right great toe is the only 1 that seems to bother him.    02/14/22: Returns to clinic for evaluation and follow-up from ER.  The right great toe is improving slowly but surely.  He still has pain with touching the toe, and is hesitant to wear certain types of shoes.  Overall though he does seem to improving.  Not yet gotten the echo or CT of the chest.       Past Medical History:  Diagnosis Date   Diabetes mellitus without complication ([place])    History of stroke        Past Surgical History:  Procedure Laterality Date   CYSTECTOMY       TONSILLECTOMY               Family History  Problem Relation Age of Onset   Cancer Father         Social History         Socioeconomic History   Marital status: Married      Spouse name: Not on file   Number of children: Not on file   Years of education: Not on file   Highest education level: Not on file  Occupational History   Not on file  Tobacco Use   Smoking status: Every Day      Packs/day: 0.50      Types: Cigarettes   Smokeless tobacco: Never  Vaping Use   Vaping Use: Never used  Substance and Sexual Activity   Alcohol use: Never   Drug use: Never   Sexual activity: Not on file  Other Topics Concern   Not on file  Social History Narrative   Not on file    Social Determinants of Health    Financial Resource Strain: Not on file  Food Insecurity: Not on file  Transportation Needs: Not on file  Physical Activity: Not on file  Stress: Not on file  Social Connections: Not on file  Intimate Partner Violence: Not on file      No Known Allergies   No current facility-administered medications for this encounter.          Current Outpatient Medications  Medication Sig Dispense Refill   aspirin 81 MG chewable tablet Chew 1 tablet (81 mg total) by mouth daily. 30 tablet 0   atorvastatin (LIPITOR) 20 MG tablet Take by mouth.       budesonide-formoterol (SYMBICORT) 160-4.5 MCG/ACT inhaler Inhale into the lungs.       glipiZIDE (GLUCOTROL) 5 MG tablet Take 5 mg by mouth daily.       metoprolol succinate (TOPROL-XL) 25 MG 24 hr tablet Take by mouth.       midodrine (PROAMATINE) 5 MG tablet midodrine 5 mg tablet       pyridostigmine (MESTINON) 60 MG tablet pyridostigmine bromide 60 mg tablet          REVIEW OF SYSTEMS:  [X]  denotes positive finding, [ ]  denotes negative finding Cardiac   Comments:  Chest pain or chest pressure:      Shortness of breath upon exertion:      Short of breath when lying flat:      Irregular heart rhythm:             Vascular      Pain in calf, thigh, or hip brought on by ambulation:      Pain in feet at night that wakes you up  from your sleep:       Blood clot in your veins:      Leg swelling:              Pulmonary      Oxygen at home:      Productive cough:       Wheezing:              Neurologic      Sudden weakness in arms or legs:       Sudden numbness in arms or legs:       Sudden onset of difficulty speaking or slurred speech:      Temporary loss of vision in one eye:       Problems with dizziness:              Gastrointestinal      Blood in stool:       Vomited blood:              Genitourinary      Burning when urinating:       Blood in urine:             Psychiatric      Major depression:              Hematologic      Bleeding problems:      Problems with blood clotting too easily:             Skin      Rashes or ulcers:             Constitutional      Fever or chills:          PHYSICAL EXAM       Vitals:    12/31/21 1353 12/31/21 1515 12/31/21 1530 12/31/21 1603  BP: (!) 154/86 (!) 162/92 (!) 165/89    Pulse: 69 79 71    Resp: 16     16  Temp:       97.8 F (36.6 C)  TempSrc:       Oral  SpO2: 97% 99% 98%    Weight:          Height:  Constitutional: Chronically ill appearing. no distress. Appears well nourished.  Neurologic: Some difficulty with expression and word finding. CN intact. no focal findings. no sensory loss. Psychiatric:  Mood and affect symmetric and appropriate. Eyes:  No icterus. No conjunctival pallor. Ears, nose, throat:  mucous membranes moist. Midline trachea.  Cardiac: regular rate and rhythm.  Respiratory:  unlabored. Abdominal:  soft, non-tender, non-distended.  Peripheral vascular: 2+ DP pulses bilaterally Extremity: no edema. no cyanosis. no pallor.  Skin:    Lymphatic: no Stemmer's sign. no palpable lymphadenopathy.   PERTINENT LABORATORY AND RADIOLOGIC DATA   Most recent CBC CBC Latest Ref Rng & Units 12/31/2021 12/30/2021  WBC 4.0 - 10.5 K/uL 11.8(H) 13.9(H)  Hemoglobin 13.0 - 17.0 g/dL 15.5 16.0  Hematocrit 39.0 - 52.0  % 46.6 47.7  Platelets 150 - 400 K/uL 590(H) 609(H)      Most recent CMP CMP Latest Ref Rng & Units 12/31/2021 12/30/2021 12/21/2021  Glucose 70 - 99 mg/dL 186(H) 85 -  BUN 8 - 23 mg/dL 22 22 -  Creatinine 0.61 - 1.24 mg/dL 1.54(H) 1.43(H) 1.50(H)  Sodium 135 - 145 mmol/L 137 138 -  Potassium 3.5 - 5.1 mmol/L 4.4 4.0 -  Chloride 98 - 111 mmol/L 105 105 -  CO2 22 - 32 mmol/L 24 25 -  Calcium 8.9 - 10.3 mg/dL 9.3 9.8 -  Total Protein 6.5 - 8.1 g/dL - 7.1 -  Total Bilirubin 0.3 - 1.2 mg/dL - 0.7 -  Alkaline Phos 38 - 126 U/L - 56 -  AST 15 - 41 U/L - 15 -  ALT 0 - 44 U/L - 17 -     Jennelle Pinkstaff N. Stanford Breed, MD [facility] Phone Number: [phone] 02/14/2022 1:47 PM    Total time spent on preparing this encounter including chart review, data review, collecting history, examining the patient, coordinating care for this established patient, 30 minutes.   Portions of this report may have been transcribed using voice recognition software.  Every effort has been made to ensure accuracy; however, inadvertent computerized transcription errors may still be present.

## 2022-02-14 ENCOUNTER — Encounter: Payer: Self-pay | Admitting: Vascular Surgery

## 2022-02-14 ENCOUNTER — Ambulatory Visit: Payer: Medicare HMO | Admitting: Vascular Surgery

## 2022-02-14 ENCOUNTER — Other Ambulatory Visit: Payer: Self-pay

## 2022-02-14 VITALS — BP 129/75 | HR 81 | Temp 98.5°F | Resp 20 | Ht 71.0 in | Wt 194.0 lb

## 2022-02-14 DIAGNOSIS — I75021 Atheroembolism of right lower extremity: Secondary | ICD-10-CM

## 2022-02-15 ENCOUNTER — Encounter (HOSPITAL_COMMUNITY): Payer: Self-pay | Admitting: Physical Therapy

## 2022-02-15 ENCOUNTER — Ambulatory Visit (HOSPITAL_COMMUNITY): Payer: Medicare HMO | Attending: Otolaryngology | Admitting: Physical Therapy

## 2022-02-15 DIAGNOSIS — Z720 Tobacco use: Secondary | ICD-10-CM | POA: Diagnosis not present

## 2022-02-15 DIAGNOSIS — R296 Repeated falls: Secondary | ICD-10-CM | POA: Diagnosis not present

## 2022-02-15 DIAGNOSIS — E119 Type 2 diabetes mellitus without complications: Secondary | ICD-10-CM | POA: Diagnosis not present

## 2022-02-15 DIAGNOSIS — Z8679 Personal history of other diseases of the circulatory system: Secondary | ICD-10-CM | POA: Diagnosis not present

## 2022-02-15 DIAGNOSIS — I739 Peripheral vascular disease, unspecified: Secondary | ICD-10-CM | POA: Diagnosis not present

## 2022-02-15 DIAGNOSIS — R262 Difficulty in walking, not elsewhere classified: Secondary | ICD-10-CM

## 2022-02-15 DIAGNOSIS — R42 Dizziness and giddiness: Secondary | ICD-10-CM | POA: Diagnosis not present

## 2022-02-15 DIAGNOSIS — R270 Ataxia, unspecified: Secondary | ICD-10-CM | POA: Diagnosis not present

## 2022-02-15 DIAGNOSIS — N400 Enlarged prostate without lower urinary tract symptoms: Secondary | ICD-10-CM | POA: Diagnosis not present

## 2022-02-15 DIAGNOSIS — J449 Chronic obstructive pulmonary disease, unspecified: Secondary | ICD-10-CM | POA: Diagnosis not present

## 2022-02-15 DIAGNOSIS — R351 Nocturia: Secondary | ICD-10-CM | POA: Diagnosis not present

## 2022-02-15 NOTE — Therapy (Signed)
[facility] ?[address] ?[place], [place], [address] ?Phone: [phone]   Fax:  [phone] ? ?Physical Therapy Treatment ? ?Patient Details  ?Name: Ryan Mccarty ?MRN: [id number] ?Date of Birth: 01/15/1951 ?Referring Provider (PT): Leta Baptist MD ? ? ?Encounter Date: 02/15/2022 ? ? PT End of Session - 02/15/22 0958   ? ? Visit Number 2   ? Number of Visits 12   ? Date for PT Re-Evaluation 03/23/22   ? Authorization Type [employer]   ? Authorization Time Period check auth   ? Progress Note Due on Visit 10   ? PT Start Time 1000   ? PT Stop Time 3762   ? PT Time Calculation (min) 40 min   ? Equipment Utilized During Treatment Gait belt   ? Activity Tolerance Patient tolerated treatment well;Patient limited by fatigue   ? Behavior During Therapy [facility] for tasks assessed/performed   ? ?  ?  ? ?  ? ? ?Past Medical History:  ?Diagnosis Date  ? Diabetes mellitus without complication ([place])   ? ? ?Past Surgical History:  ?Procedure Laterality Date  ? CYSTECTOMY    ? TONSILLECTOMY    ? ? ?There were no vitals filed for this visit. ? ? Subjective Assessment - 02/15/22 1002   ? ? 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  He is not dizzy all the time most the time it occurs when he stands up   ? Diagnostic tests MRI   ? ?  ?  ? ?  ? ? ? ? ? [facility] PT Assessment - 02/15/22 0001   ? ?  ? ROM / Strength  ? AROM / PROM / Strength AROM   ?  ? AROM  ? AROM Assessment Site Cervical   ? Cervical - Right Rotation 50   ? Cervical - Left Rotation 50   ? ?  ?  ? ?  ? ? ? ? ? ? Vestibular Assessment - 02/15/22 0001   ? ?  ? Symptom Behavior  ? Subjective history of current problem When it happens it just happens and I fall.  Pt states he can not tell me how often this happens or what causes it   ? Type of Dizziness  Lightheadedness   ? Frequency of Dizziness unable to state   ? Duration of Dizziness lasts when he is up   ? Aggravating Factors Activity  in general   ? Relieving Factors Rest   ?  ? Oculomotor Exam  ? Ocular ROM no increase of sx   ? Smooth Pursuits Intact   ? Saccades Intact   ? ?  ?  ? ?  ? ? ? ? ? ? ? ? ? ? ? ? ? ? ? ? Balance Exercises - 02/15/22 0001   ? ?  ? Balance Exercises: Standing  ? Tandem Stance Eyes open;3 reps   ? SLS Eyes open;3 reps   ? Sidestepping 2 reps   ? Heel Raises 10 reps   ? Sit to Stand Standard surface   ? ?  ?  ? ?  ? ? ? ? ? ? ? PT Short Term Goals - 02/15/22 1030   ? ?  ? PT SHORT TERM GOAL #1  ? Title Patient will be independent with initial HEP and self-management strategies to improve functional outcomes   ? Time 3   ? Period Weeks   ?  Status On-going   ? Target Date 03/02/22   ?  ? PT SHORT TERM GOAL #2  ? Title Patient will improve DGI by at least 5 points to demo improved balance and reduced risk for falls.   ? Time 3   ? Period Weeks   ? Status On-going   ? Target Date 03/02/22   ? ?  ?  ? ?  ? ? ? ? PT Long Term Goals - 02/15/22 1030   ? ?  ? PT LONG TERM GOAL #1  ? Title Patient will improve DGI by at least 10 points to demo improved balance and reduced risk for falls.   ? Time 6   ? Period Weeks   ? Status On-going   ? Target Date 03/23/22   ?  ? PT LONG TERM GOAL #2  ? Title Patient will have equal to or > 4+/5 MMT throughout BLE to improve ability to perform functional mobility, stair ambulation and ADLs.   ? Time 6   ? Period Weeks   ? Status On-going   ? Target Date 03/23/22   ?  ? PT LONG TERM GOAL #3  ? Title Patient will demo correct use of LRAD when ambulation for reduced risk for future falls.   ? Time 6   ? Period Weeks   ? Status On-going   ? Target Date 03/23/22   ? ?  ?  ? ?  ? ? ? ? ? ? ? ? Plan - 02/15/22 1046   ? ? Clinical Impression Statement Therapist assessed pt cervical rotation which was limited B, gave exercises to improve.  Pt comes to the department using a rollator, ambulating with trunk flexed to 40 degrees and looking at the floor, pt educated in proper way to be ambulating with  rollator.  Therapist also gave pt posure correcting exercises.  PT continues to be anxious about standing without assistive device and will benefit from skilled pt to improve core strength and balance to decrease risk of falling   ? Examination-Activity Limitations Locomotion Level;Stairs;Transfers;Stand   ? Examination-Participation Restrictions Management consultant;Shop   ? Rehab Potential Fair   ? PT Frequency 2x / week   ? PT Duration 6 weeks   ? PT Treatment/Interventions ADLs/Self Care Home Management;Aquatic Therapy;Iontophoresis 4mg /ml Dexamethasone;Moist Heat;Stair training;Gait training;DME Instruction;Traction;Canalith Repostioning;Ultrasound;Functional mobility training;Therapeutic activities;Parrafin;Fluidtherapy;Cryotherapy;Electrical Stimulation;Contrast Bath;Therapeutic exercise;Patient/family education;Orthotic Fit/Training;Manual lymph drainage;Compression bandaging;Splinting;Taping;Vasopneumatic Device;Joint Manipulations;Spinal Manipulations;Dry needling;Manual techniques;Neuromuscular re-education;Scar mobilization;Balance training;Vestibular;Passive range of motion;Visual/perceptual remediation/compensation;Energy conservation   ? PT Next Visit Plan Progress hip strength and balance as able. Gait training with AD   ? PT Home Exercise Plan cervical rotation, sit to stand, sitting posture correction   ? ?  ?  ? ?  ? ? ?Patient will benefit from skilled therapeutic intervention in order to improve the following deficits and impairments:  Abnormal gait, Pain, Improper body mechanics, Dizziness, Decreased knowledge of use of DME, Decreased coordination, Decreased mobility, Decreased endurance, Decreased strength, Impaired perceived functional ability, Decreased balance, Difficulty walking ? ?Visit Diagnosis: ?Dizziness and giddiness ? ?Difficulty in walking, not elsewhere classified ? ?Repeated falls ? ? ? ? ?Problem List ?Patient Active Problem List  ? Diagnosis Date Noted  ?  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 Other elevated white blood cell count 01/21/2020  ? Stage 3a chronic kidney disease ([place]) 07/16/2019  ? Type 2 diabetes mellitus ([place]) 07/16/2019  ? Acute renal failure syndrome ([place]) 01/15/2019  ? Arrhythmia 01/15/2019  ?  Essential hypertension 01/15/2019  ? Hyperkalemia 01/15/2019  ? Mixed hyperlipidemia 01/15/2019  ? Vitamin D deficiency 01/15/2019  ?Rayetta Humphrey, PT CLT ?[phone]  ?02/15/2022, 10:50 AM ? ?[facility] ?[address] ?[place], [place], [address] ?Phone: [phone]   Fax:  [phone] ? ?Name: JAEDAN HUTTNER ?MRN: [id number] ?Date of Birth: [birth year] ? ? ? ?

## 2022-02-21 ENCOUNTER — Other Ambulatory Visit: Payer: Self-pay

## 2022-02-21 DIAGNOSIS — I75021 Atheroembolism of right lower extremity: Secondary | ICD-10-CM

## 2022-02-22 ENCOUNTER — Other Ambulatory Visit: Payer: Self-pay

## 2022-02-22 ENCOUNTER — Encounter: Payer: Self-pay | Admitting: Podiatry

## 2022-02-22 ENCOUNTER — Ambulatory Visit: Payer: Medicare HMO | Admitting: Podiatry

## 2022-02-22 DIAGNOSIS — M79675 Pain in left toe(s): Secondary | ICD-10-CM | POA: Diagnosis not present

## 2022-02-22 DIAGNOSIS — B351 Tinea unguium: Secondary | ICD-10-CM

## 2022-02-22 DIAGNOSIS — E1143 Type 2 diabetes mellitus with diabetic autonomic (poly)neuropathy: Secondary | ICD-10-CM

## 2022-02-22 DIAGNOSIS — M79674 Pain in right toe(s): Secondary | ICD-10-CM | POA: Diagnosis not present

## 2022-02-22 NOTE — Progress Notes (Signed)
?  Subjective:  ?Patient ID: Ryan Mccarty, male    DOB: 02-02-1951,   MRN: [id number] ? ?Chief Complaint  ?Patient presents with  ? Nail Problem  ?  Patient is a diabetic - Routine foot care - possible nail fungus  ? ? ?71 y.o. male presents for concern of thickened elongated and painful nails that are difficult to trim. Requesting to have them trimmed today. Denies any burning or tingling in her feet. Patient is diabetic and last A1c was 5.9.  recently seen in ED for darkening and red right great toe and seen by vascular. Thought to be athersclerotic and monitoring the toe. Relates the pain in the toe has been improving.  ? . Denies any other pedal complaints. Denies n/v/f/c.  ? ?Past Medical History:  ?Diagnosis Date  ? Diabetes mellitus without complication ([place])   ? ? ?Objective:  ?Physical Exam: ?Vascular: DP/PT pulses 2/4 bilateral. CFT <5 seconds. Abnormal hair growth on digits. Mild edema to lower extremities.  ?Skin. No lacerations or abrasions bilateral feet. Mild erythema noted to right great toe. Nails 1-5 bilateral are thickened and elongated with subungual debris.  ?Musculoskeletal: MMT 5/5 bilateral lower extremities in DF, PF, Inversion and Eversion. Deceased ROM in DF of ankle joint.  ?Neurological: Sensation intact to light touch.  ? ?Assessment:  ? ?1. 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 Pain due to onychomycosis of toenails of both feet   ? ? ? ?Plan:  ?Patient was evaluated and treated and all questions answered. ?-Discussed and educated patient on diabetic foot care, especially with  ?regards to the vascular, neurological and musculoskeletal systems.  ?-Stressed the importance of good glycemic control and the detriment of not  ?controlling glucose levels in relation to the foot. ?-Discussed supportive shoes at all times and checking feet regularly.  ?-Mechanically debrided all nails 1-5 bilateral using sterile nail nipper and filed with dremel without incident   ?-Answered all patient questions ?-Patient to return  in 3 months for at risk foot care ?-Patient advised to call the office if any problems or questions arise in the meantime. ? ? ?Lorenda Peck, DPM  ? ? ?

## 2022-02-23 ENCOUNTER — Ambulatory Visit
Admission: RE | Admit: 2022-02-23 | Discharge: 2022-02-23 | Disposition: A | Discharge status: Home / Self Care | Payer: Medicare HMO | Source: Ambulatory Visit | Attending: Vascular Surgery | Admitting: Vascular Surgery

## 2022-02-23 DIAGNOSIS — I75021 Atheroembolism of right lower extremity: Secondary | ICD-10-CM

## 2022-02-23 DIAGNOSIS — J439 Emphysema, unspecified: Secondary | ICD-10-CM | POA: Diagnosis not present

## 2022-02-23 DIAGNOSIS — I3139 Other pericardial effusion (noninflammatory): Secondary | ICD-10-CM | POA: Diagnosis not present

## 2022-02-23 DIAGNOSIS — R911 Solitary pulmonary nodule: Secondary | ICD-10-CM | POA: Diagnosis not present

## 2022-02-23 IMAGING — CT CT CHEST W/O CM
2 of 5 series · 14 of 36 positions shown, 17 images · non-contrast
Comparison: PA chest and rib series [DATE] report is available
with films unable to be retrieved from PACS.

CLINICAL DATA: Frequent falls. History of tobacco use, aortic
atherosclerosis with atheroembolism right foot.



[Series 4: chest 2.00 br40 s3 · coronal · 0.71mm/px · 3 of 162 slices shown]
[im 33/162  lung]
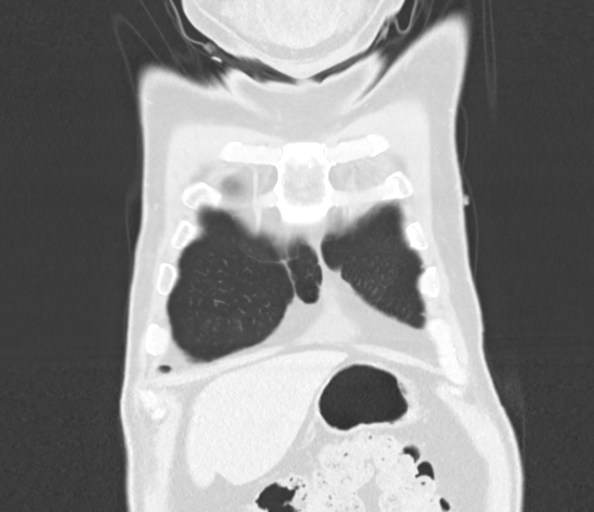
[im 65/162  lung]
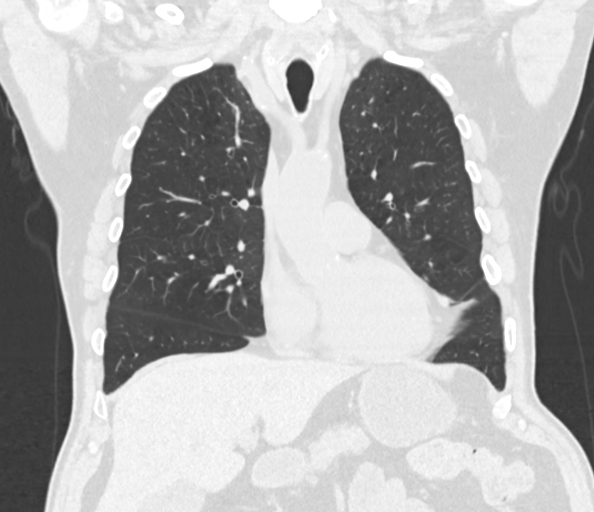
[im 97/162  lung]
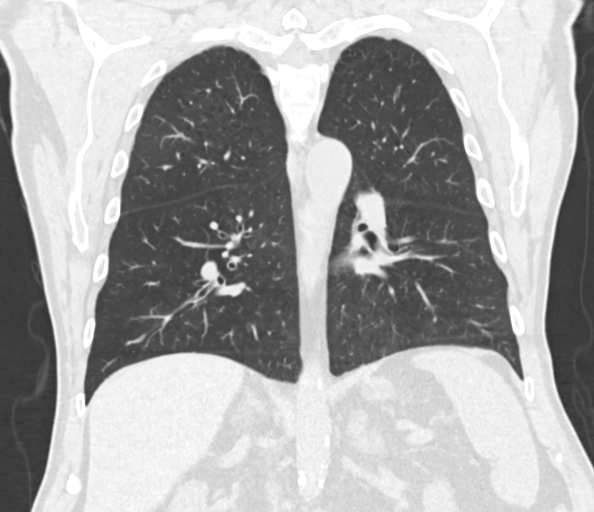

[Series 10: chest 1.00 br40 s3 super d · axial · 0.83mm/px · z∈[+1358,+1673]mm · 11 of 456 slices shown, 14 images]
[im 31/456  mediastinal]
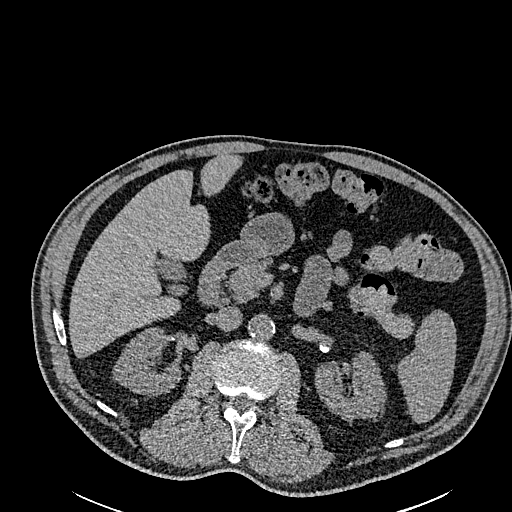
[im 31/456  lung]
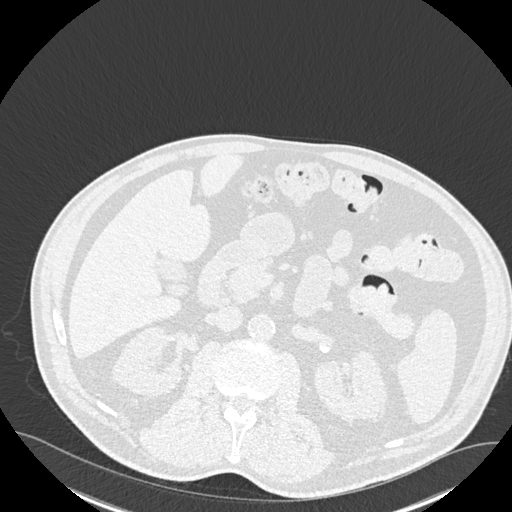
[im 61/456  lung]
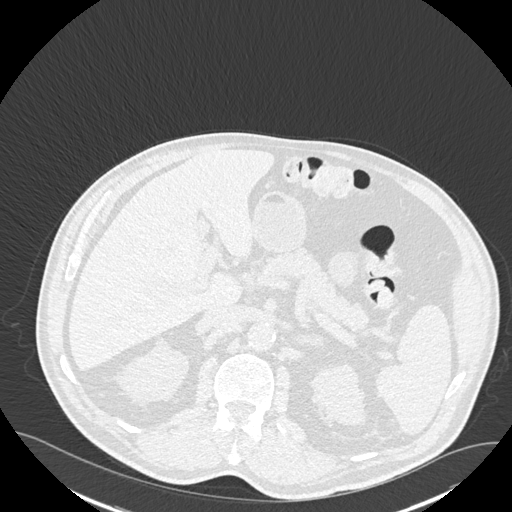
[im 122/456  lung]
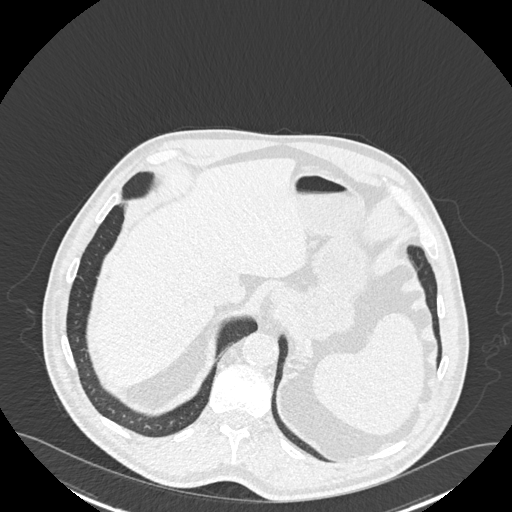
[im 152/456  lung]
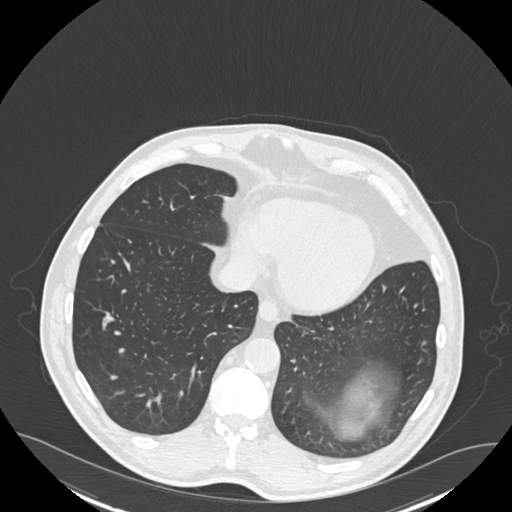
[im 183/456  mediastinal]
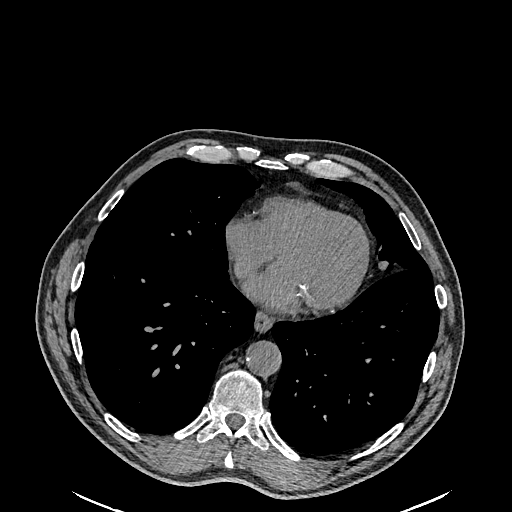
[im 183/456  lung]
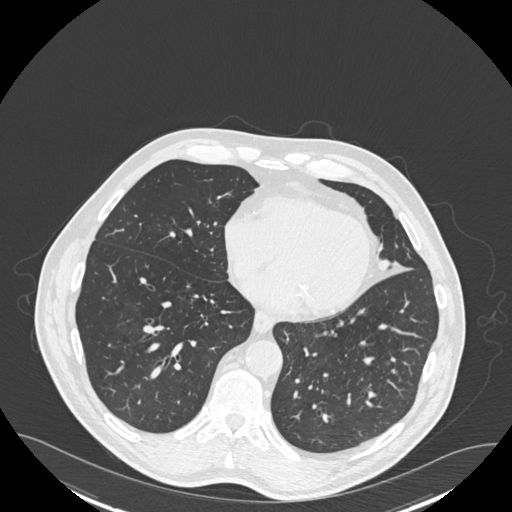
[im 243/456  lung]
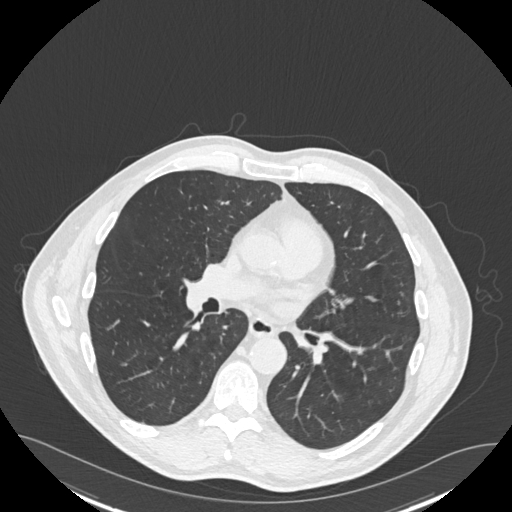
[im 274/456  lung]
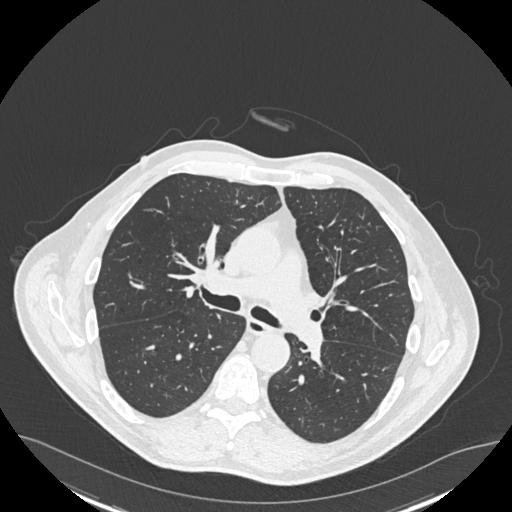
[im 304/456  lung]
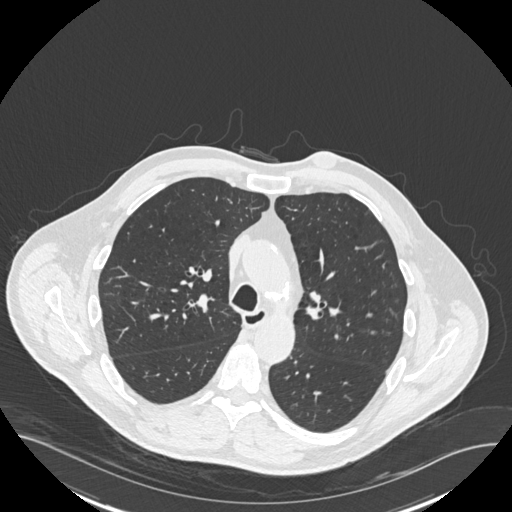
[im 334/456  mediastinal]
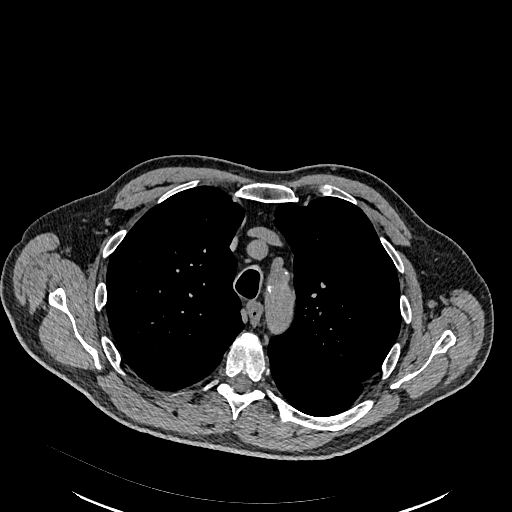
[im 334/456  lung]
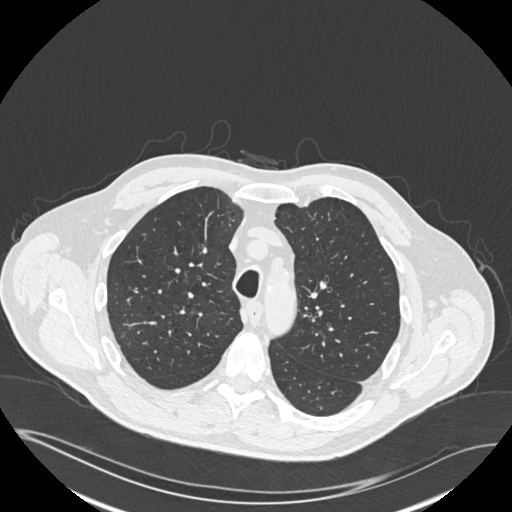
[im 395/456  lung]
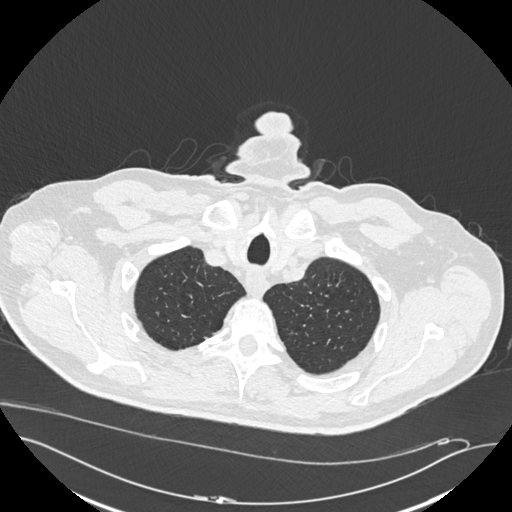
[im 425/456  lung]
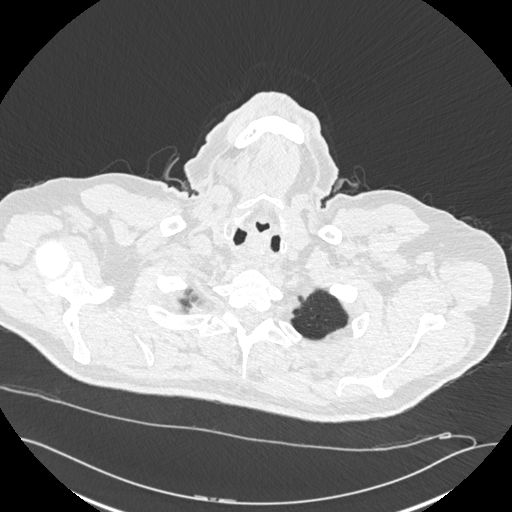

[14 of 36 positions shown; findings below may reference images not displayed]

FINDINGS: Cardiovascular: The cardiac size is normal. There is small
pericardial effusion anteriorly. There are scattered three-vessel
calcific CAD.

Pulmonary arteries and veins are normal in caliber. There is
moderate aortic and great vessel atherosclerosis, normal great
vessel branching and an ectatic ascending segment measuring 3.8 cm
with remainder normal caliber.

Mediastinum/Nodes: No enlarged mediastinal or axillary lymph nodes.
Thyroid gland, trachea, and esophagus demonstrate no significant
findings.

Lungs/Pleura: The lungs are mildly emphysematous with centrilobular
changes predominating in the upper lobes. There is a calcified
granuloma in the right lower lobe. There is a linear calcified
granuloma in the left lower lobe.

There is a 10 mm nodule in the posterior lingular base on series 8
axial 112 as well as linear scar-like opacity above this.

Additional linear scarring medial right middle lobe base. 3 mm
nodule noted anteriorly in the left upper lobe on axial 41.

Remainder of the lungs are clear. No fibrosis or bronchiectasis is
seen.

Central airways are clear. No pleural effusion, thickening or
pneumothorax.

Upper Abdomen: 1.7 cm right adrenal nodule of 26 Hounsfield units
above the typical density for an adenoma. Further evaluation
recommended. There is mild nodular thickening of the left adrenal
gland. There are small stones in the gallbladder with no wall
thickening or biliary dilatation.

Mildly enlarged spleen measures 14.2 cm length. The visualized
liver, spleen and pancreas otherwise unremarkable. There is a 1.8 cm
cyst partially visible of the lateral left kidney. Density 24
Hounsfield units above the usual density of fluid. Ultrasound
follow-up recommended.

Musculoskeletal: There are mild degenerative changes and exaggerated
kyphosis of the thoracic spine, osteopenia. No worrisome regional
bone lesion.

In the upper medial left chest wall there is a 2.0 x 1.2 cm
subcutaneous rounded well-circumscribed low-density lesion of 27
Hounsfield units. There is no further chest wall abnormality.
IMPRESSION: 1. 2 nodules in the left upper lobe measuring 3 mm and 10 mm.
Three-month follow-up CT or PET-CT recommended.
2. Emphysema.
3. Aortic and coronary artery atherosclerosis. Small pericardial
effusion.
4. Cholelithiasis.
5. Indeterminate right adrenal nodule. Also mild nodular thickening
of the left adrenal gland. Further evaluation recommended.
6. Mild splenomegaly.
7. Lateral left renal cystic lesion 1.8 cm above the typical density
of fluid. Ultrasound follow-up recommended to assess for cystic
complexity or proteinaceous or hyperdense cyst.
8. Left chest wall lesion, probable sebaceous cyst but a low-density
solid lesion not excluded. Clinical correlation recommended.

## 2022-03-01 ENCOUNTER — Encounter (HOSPITAL_COMMUNITY): Payer: Self-pay | Admitting: Physical Therapy

## 2022-03-01 ENCOUNTER — Ambulatory Visit (HOSPITAL_COMMUNITY): Payer: Medicare HMO | Admitting: Physical Therapy

## 2022-03-01 ENCOUNTER — Other Ambulatory Visit: Payer: Self-pay

## 2022-03-01 DIAGNOSIS — R296 Repeated falls: Secondary | ICD-10-CM | POA: Diagnosis not present

## 2022-03-01 DIAGNOSIS — R42 Dizziness and giddiness: Secondary | ICD-10-CM | POA: Diagnosis not present

## 2022-03-01 DIAGNOSIS — R262 Difficulty in walking, not elsewhere classified: Secondary | ICD-10-CM | POA: Diagnosis not present

## 2022-03-01 NOTE — Therapy (Signed)
[facility] ?[address] ?[place], [place], [address] ?Phone: [phone]   Fax:  [phone] ? ?Physical Therapy Treatment ? ?Patient Details  ?Name: Ryan Mccarty ?MRN: [id number] ?Date of Birth: 1951/07/31 ?Referring Provider (PT): Leta Baptist MD ? ? ?Encounter Date: 03/01/2022 ? ? PT End of Session - 03/01/22 0907   ? ? Visit Number 3   ? Number of Visits 12   ? Date for PT Re-Evaluation 03/23/22   ? Authorization Type [employer]   ? Authorization Time Period 12 approved 2/23-03/23/22   ? Authorization - Visit Number 3   ? Authorization - Number of Visits 12   ? Progress Note Due on Visit 10   ? PT Start Time [phone]   ? PT Stop Time [phone]   ? PT Time Calculation (min) 39 min   ? Equipment Utilized During Treatment Gait belt   ? Activity Tolerance Patient tolerated treatment well;Patient limited by fatigue   ? Behavior During Therapy [facility] for tasks assessed/performed   ? ?  ?  ? ?  ? ? ?Past Medical History:  ?Diagnosis Date  ? Diabetes mellitus without complication ([place])   ? ? ?Past Surgical History:  ?Procedure Laterality Date  ? CYSTECTOMY    ? TONSILLECTOMY    ? ? ?There were no vitals filed for this visit. ? ? Subjective Assessment - 03/01/22 0908   ? ? Subjective Patient says today is a bad day. He fell Monday and landed on his tailbone. It is sore but reports no injury otherwise. His neighbor helped him up.   ? Limitations Walking;Standing;House hold activities   ? Diagnostic tests MRI   ? Currently in Pain? Yes   ? Pain Score 4    ? Pain Location Sacrum   ? Pain Orientation Lower   ? Pain Descriptors / Indicators Sore   ? Pain Type Acute pain   ? ?  ?  ? ?  ? ? ? ? ? ? ? ? ? ? ? ? ? ? ? ? ? ? ? ? [place] Adult PT Treatment/Exercise - 03/01/22 0001   ? ?  ? Exercises  ? Exercises Shoulder   ?  ? Shoulder Exercises: Seated  ? Other Seated Exercises scapular retraction and chin tucks 15 x 5" each   ? ?  ?  ? ?  ? ? ? ? ? ? Balance Exercises - 03/01/22 0001   ? ?  ?  Balance Exercises: Standing  ? Standing Eyes Opened Narrow base of support (BOS);Other reps (comment);Head turns   x20  ? Tandem Stance Eyes open;3 reps;30 secs   ? Sidestepping --   ? Heel Raises 15 reps   ? Sit to Stand --   x10  ? Other Standing Exercises gait training with AD 200 feet with RW   ? ?  ?  ? ?  ? ? ? ? ? ? ? PT Short Term Goals - 02/15/22 1030   ? ?  ? PT SHORT TERM GOAL #1  ? Title Patient will be independent with initial HEP and self-management strategies to improve functional outcomes   ? Time 3   ? Period Weeks   ? Status On-going   ? Target Date 03/02/22   ?  ? PT SHORT TERM GOAL #2  ? Title Patient will improve DGI by at least 5 points to demo improved balance and reduced risk for falls.   ? Time 3   ?  Period Weeks   ? Status On-going   ? Target Date 03/02/22   ? ?  ?  ? ?  ? ? ? ? PT Long Term Goals - 02/15/22 1030   ? ?  ? PT LONG TERM GOAL #1  ? Title Patient will improve DGI by at least 10 points to demo improved balance and reduced risk for falls.   ? Time 6   ? Period Weeks   ? Status On-going   ? Target Date 03/23/22   ?  ? PT LONG TERM GOAL #2  ? Title Patient will have equal to or > 4+/5 MMT throughout BLE to improve ability to perform functional mobility, stair ambulation and ADLs.   ? Time 6   ? Period Weeks   ? Status On-going   ? Target Date 03/23/22   ?  ? PT LONG TERM GOAL #3  ? Title Patient will demo correct use of LRAD when ambulation for reduced risk for future falls.   ? Time 6   ? Period Weeks   ? Status On-going   ? Target Date 03/23/22   ? ?  ?  ? ?  ? ? ? ? ? ? ? ? Plan - 03/01/22 0946   ? ? Clinical Impression Statement Patient continues to be highly challenged with gait and balance activity. Patient required verbal cues for upright posturing during standing balance. Patient cued on approximation to AD when gait training using RW. Patient educated on use of AD for safety, again, as he arrived to clinic without AD and is a high fall risk. Will continue to progress gait  and balance as tolerated for reduced risk for falls.   ? Examination-Activity Limitations Locomotion Level;Stairs;Transfers;Stand   ? Examination-Participation Restrictions Management consultant;Shop   ? Rehab Potential Fair   ? PT Frequency 2x / week   ? PT Duration 6 weeks   ? PT Treatment/Interventions ADLs/Self Care Home Management;Aquatic Therapy;Iontophoresis '4mg'$ /ml Dexamethasone;Moist Heat;Stair training;Gait training;DME Instruction;Traction;Canalith Repostioning;Ultrasound;Functional mobility training;Therapeutic activities;Parrafin;Fluidtherapy;Cryotherapy;Electrical Stimulation;Contrast Bath;Therapeutic exercise;Patient/family education;Orthotic Fit/Training;Manual lymph drainage;Compression bandaging;Splinting;Taping;Vasopneumatic Device;Joint Manipulations;Spinal Manipulations;Dry needling;Manual techniques;Neuromuscular re-education;Scar mobilization;Balance training;Vestibular;Passive range of motion;Visual/perceptual remediation/compensation;Energy conservation   ? PT Next Visit Plan Progress hip strength and balance as able. Gait training with AD   ? PT Home Exercise Plan cervical rotation, sit to stand, sitting posture correction   ? Consulted and Agree with Plan of Care Patient   ? ?  ?  ? ?  ? ? ?Patient will benefit from skilled therapeutic intervention in order to improve the following deficits and impairments:  Abnormal gait, Pain, Improper body mechanics, Dizziness, Decreased knowledge of use of DME, Decreased coordination, Decreased mobility, Decreased endurance, Decreased strength, Impaired perceived functional ability, Decreased balance, Difficulty walking ? ?Visit Diagnosis: ?Dizziness and giddiness ? ?Difficulty in walking, not elsewhere classified ? ?Repeated falls ? ? ? ? ?Problem List ?Patient Active Problem List  ? Diagnosis Date Noted  ? 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 Other elevated white blood cell count 01/21/2020  ? Stage  3a chronic kidney disease ([place]) 07/16/2019  ? Type 2 diabetes mellitus ([place]) 07/16/2019  ? Acute renal failure syndrome ([place]) 01/15/2019  ? Arrhythmia 01/15/2019  ? Essential hypertension 01/15/2019  ? Hyperkalemia 01/15/2019  ? Mixed hyperlipidemia 01/15/2019  ? Vitamin D deficiency 01/15/2019  ? ?9:47 AM, 03/01/22 ?Josue Hector PT DPT  ?Physical Therapist with [place]  ?[facility]  ?[phone] ? ?[place] [facility] ?[address] ?[place], [place], [address] ?Phone: [phone]   Fax:  [phone] ? ?Name: Ryan Mccarty ?MRN: [id number] ?Date of Birth: 1951-05-14 ? ? ? ?

## 2022-03-02 ENCOUNTER — Other Ambulatory Visit: Payer: Self-pay

## 2022-03-03 ENCOUNTER — Encounter (HOSPITAL_COMMUNITY): Payer: Self-pay | Admitting: Physical Therapy

## 2022-03-03 ENCOUNTER — Other Ambulatory Visit: Payer: Self-pay

## 2022-03-03 ENCOUNTER — Ambulatory Visit (HOSPITAL_COMMUNITY): Payer: Medicare HMO | Admitting: Physical Therapy

## 2022-03-03 DIAGNOSIS — R296 Repeated falls: Secondary | ICD-10-CM

## 2022-03-03 DIAGNOSIS — R262 Difficulty in walking, not elsewhere classified: Secondary | ICD-10-CM | POA: Diagnosis not present

## 2022-03-03 DIAGNOSIS — R42 Dizziness and giddiness: Secondary | ICD-10-CM

## 2022-03-03 NOTE — Therapy (Signed)
?OUTPATIENT PHYSICAL THERAPY TREATMENT NOTE ? ? ?Patient Name: Ryan Mccarty ?MRN: [id number] ?DOB:March 06, 1951, 71 y.o., male ?Today's Date: 03/03/2022 ? ?PCP: Bonnita Hollow, MD ?Elmon Kirschner, MD  ? ? PT End of Session - 03/03/22 0907   ? ? Visit Number 4   ? Number of Visits 12   ? Date for PT Re-Evaluation 03/23/22   ? Authorization Type [employer]   ? Authorization Time Period 12 approved 2/23-03/23/22   ? Authorization - Visit Number 4   ? Authorization - Number of Visits 12   ? Progress Note Due on Visit 10   ? PT Start Time [phone]   ? PT Stop Time [phone]   ? PT Time Calculation (min) 39 min   ? Equipment Utilized During Treatment Gait belt   ? Activity Tolerance Patient tolerated treatment well;Patient limited by fatigue   ? Behavior During Therapy [facility] for tasks assessed/performed   ? ?  ?  ? ?  ? ? ?Past Medical History:  ?Diagnosis Date  ? Diabetes mellitus without complication ([place])   ? ?Past Surgical History:  ?Procedure Laterality Date  ? CYSTECTOMY    ? TONSILLECTOMY    ? ?Patient Active Problem List  ? Diagnosis Date Noted  ? 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 Other elevated white blood cell count 01/21/2020  ? Stage 3a chronic kidney disease ([place]) 07/16/2019  ? Type 2 diabetes mellitus ([place]) 07/16/2019  ? Acute renal failure syndrome ([place]) 01/15/2019  ? Arrhythmia 01/15/2019  ? Essential hypertension 01/15/2019  ? Hyperkalemia 01/15/2019  ? Mixed hyperlipidemia 01/15/2019  ? Vitamin D deficiency 01/15/2019  ? ? ?REFERRING DIAG: dizziness  ? ?THERAPY DIAG:  ?Dizziness and giddiness ? ?Difficulty in walking, not elsewhere classified ? ?Repeated falls ? ?PERTINENT HISTORY: Diagnostic imaging (MRI brain): "IMPRESSION: ?Mildly motion degraded exam. ?  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 ?  ?Background chronic small vessel ischemic changes which are  advanced ?in the cerebral white matter, and mild in the pons. ?  ?Prominent perivascular space versus chronic lacunar infarct within ?the left thalamus. ?  ?Mild generalized cerebral and cerebellar atrophy." ? ?PRECAUTIONS:  Fall ? ?SUBJECTIVE: "Not too good today" No reason given. No new falls ? ?PAIN:  ?Are you having pain? Yes: NPRS scale: 3/10 ?Pain location: sacrum  ?Pain description: sore ?Aggravating factors: sitting ?Relieving factors: standing, walking  ? ? ? ? ?TODAY'S TREATMENT:  ?03/03/22 ?Heel raises x20 ?Toe raises x20  ?Marching x20 HHA x1 ?Step taps 6 inch step x20 HHA x1  ?Staggered stance 3 x 30"  ?NBOS on foam eyes open/ eyes closed 3 x 30" each  ?Sidestepping 3 RT  ? ? ? ? ?PATIENT EDUCATION: ?Education details: on exercise for and function  ?Person educated: Patient ?Education method: Explanation and Demonstration ?Education comprehension: verbalized understanding and returned demonstration ? ? ?HOME EXERCISE PROGRAM: ?cervical rotation, sit to stand, sitting posture correction  ? ? PT Short Term Goals - 02/15/22 1030   ? ?  ? PT SHORT TERM GOAL #1  ? Title Patient will be independent with initial HEP and self-management strategies to improve functional outcomes   ? Time 3   ? Period Weeks   ? Status On-going   ? Target Date 03/02/22   ?  ? PT SHORT TERM GOAL #2  ? Title Patient will improve DGI by at least 5 points  to demo improved balance and reduced risk for falls.   ? Time 3   ? Period Weeks   ? Status On-going   ? Target Date 03/02/22   ? ?  ?  ? ?  ? ? ? PT Long Term Goals - 02/15/22 1030   ? ?  ? PT LONG TERM GOAL #1  ? Title Patient will improve DGI by at least 10 points to demo improved balance and reduced risk for falls.   ? Time 6   ? Period Weeks   ? Status On-going   ? Target Date 03/23/22   ?  ? PT LONG TERM GOAL #2  ? Title Patient will have equal to or > 4+/5 MMT throughout BLE to improve ability to perform functional mobility, stair ambulation and ADLs.   ? Time 6   ? Period  Weeks   ? Status On-going   ? Target Date 03/23/22   ?  ? PT LONG TERM GOAL #3  ? Title Patient will demo correct use of LRAD when ambulation for reduced risk for future falls.   ? Time 6   ? Period Weeks   ? Status On-going   ? Target Date 03/23/22   ? ?  ?  ? ?  ? ? ? ? Plan - 03/01/22 0946   ?  ?  Clinical Impression Statement Continued work with balance and gait training. Patient continues to be challenged with static balance. Requires min guard for stability with added NBOS on foam, more challenged with eyes closed. Patient required frequent verbal cues for increasing step height and stride length with sidestepping. Patient will continue to benefit from skilled therapy services to reduce remaining deficits and improve functional ability.  ?  ?  Examination-Activity Limitations Locomotion Level;Stairs;Transfers;Stand   ?  Examination-Participation Restrictions Management consultant;Shop   ?  Rehab Potential Fair   ?  PT Frequency 2x / week   ?  PT Duration 6 weeks   ?  PT Treatment/Interventions ADLs/Self Care Home Management;Aquatic Therapy;Iontophoresis '4mg'$ /ml Dexamethasone;Moist Heat;Stair training;Gait training;DME Instruction;Traction;Canalith Repostioning;Ultrasound;Functional mobility training;Therapeutic activities;Parrafin;Fluidtherapy;Cryotherapy;Electrical Stimulation;Contrast Bath;Therapeutic exercise;Patient/family education;Orthotic Fit/Training;Manual lymph drainage;Compression bandaging;Splinting;Taping;Vasopneumatic Device;Joint Manipulations;Spinal Manipulations;Dry needling;Manual techniques;Neuromuscular re-education;Scar mobilization;Balance training;Vestibular;Passive range of motion;Visual/perceptual remediation/compensation;Energy conservation   ?  PT Next Visit Plan Progress hip strength and balance as able. Gait training with AD. Wall bumps, gait with turns   ?  ? ? ?9:41 AM, 03/03/22 ?Josue Hector PT DPT  ?Physical Therapist with [place]  ?[facility]   ?[phone] ? ?   ?

## 2022-03-06 ENCOUNTER — Ambulatory Visit (HOSPITAL_COMMUNITY)
Admission: RE | Admit: 2022-03-06 | Discharge: 2022-03-06 | Disposition: A | Discharge status: Home / Self Care | Payer: Medicare HMO | Source: Ambulatory Visit | Attending: Vascular Surgery | Admitting: Vascular Surgery

## 2022-03-06 ENCOUNTER — Other Ambulatory Visit: Payer: Self-pay

## 2022-03-06 ENCOUNTER — Encounter (HOSPITAL_COMMUNITY): Payer: Self-pay | Admitting: Physical Therapy

## 2022-03-06 ENCOUNTER — Ambulatory Visit (HOSPITAL_COMMUNITY): Payer: Medicare HMO | Admitting: Physical Therapy

## 2022-03-06 DIAGNOSIS — R262 Difficulty in walking, not elsewhere classified: Secondary | ICD-10-CM | POA: Diagnosis not present

## 2022-03-06 DIAGNOSIS — I10 Essential (primary) hypertension: Secondary | ICD-10-CM | POA: Insufficient documentation

## 2022-03-06 DIAGNOSIS — I75021 Atheroembolism of right lower extremity: Secondary | ICD-10-CM | POA: Insufficient documentation

## 2022-03-06 DIAGNOSIS — E119 Type 2 diabetes mellitus without complications: Secondary | ICD-10-CM | POA: Diagnosis not present

## 2022-03-06 DIAGNOSIS — Z9181 History of falling: Secondary | ICD-10-CM | POA: Diagnosis not present

## 2022-03-06 DIAGNOSIS — I700 Atherosclerosis of aorta: Secondary | ICD-10-CM | POA: Diagnosis not present

## 2022-03-06 DIAGNOSIS — R42 Dizziness and giddiness: Secondary | ICD-10-CM

## 2022-03-06 DIAGNOSIS — I3481 Nonrheumatic mitral (valve) annulus calcification: Secondary | ICD-10-CM | POA: Insufficient documentation

## 2022-03-06 DIAGNOSIS — R296 Repeated falls: Secondary | ICD-10-CM

## 2022-03-06 DIAGNOSIS — I119 Hypertensive heart disease without heart failure: Secondary | ICD-10-CM | POA: Insufficient documentation

## 2022-03-06 LAB — ECHOCARDIOGRAM COMPLETE
AR max vel: 2.92 cm2
AV Peak grad: 8.4 mmHg
Ao pk vel: 1.45 m/s
Area-P 1/2: 2.95 cm2
S' Lateral: 3 cm

## 2022-03-06 NOTE — Therapy (Signed)
?OUTPATIENT PHYSICAL THERAPY TREATMENT NOTE ? ? ?Patient Name: Ryan Mccarty ?MRN: [id number] ?DOB:1951/10/29, 71 y.o., male ?Today's Date: 03/06/2022 ? ?PCP: Bonnita Hollow, MD ?Elmon Kirschner, MD  ? ? PT End of Session - 03/06/22 0848   ? ? Visit Number 5   ? Number of Visits 12   ? Date for PT Re-Evaluation 03/23/22   ? Authorization Type [employer]   ? Authorization Time Period 12 approved 2/23-03/23/22   ? Authorization - Visit Number 5   ? Authorization - Number of Visits 12   ? Progress Note Due on Visit 10   ? PT Start Time [phone]   ? PT Stop Time [phone]   ? PT Time Calculation (min) 42 min   ? Equipment Utilized During Treatment Gait belt   ? Activity Tolerance Patient tolerated treatment well;Patient limited by fatigue   ? Behavior During Therapy [facility] for tasks assessed/performed   ? ?  ?  ? ?  ? ? ?Past Medical History:  ?Diagnosis Date  ? Diabetes mellitus without complication ([place])   ? ?Past Surgical History:  ?Procedure Laterality Date  ? CYSTECTOMY    ? TONSILLECTOMY    ? ?Patient Active Problem List  ? Diagnosis Date Noted  ? 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 Other elevated white blood cell count 01/21/2020  ? Stage 3a chronic kidney disease ([place]) 07/16/2019  ? Type 2 diabetes mellitus ([place]) 07/16/2019  ? Acute renal failure syndrome ([place]) 01/15/2019  ? Arrhythmia 01/15/2019  ? Essential hypertension 01/15/2019  ? Hyperkalemia 01/15/2019  ? Mixed hyperlipidemia 01/15/2019  ? Vitamin D deficiency 01/15/2019  ? ? ?REFERRING DIAG: dizziness  ? ?THERAPY DIAG:  ?Dizziness and giddiness ? ?Difficulty in walking, not elsewhere classified ? ?Repeated falls ? ?PERTINENT HISTORY: Diagnostic imaging (MRI brain): "IMPRESSION: ?Mildly motion degraded exam. ?  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 ?  ?Background chronic small vessel ischemic changes which are  advanced ?in the cerebral white matter, and mild in the pons. ?  ?Prominent perivascular space versus chronic lacunar infarct within ?the left thalamus. ?  ?Mild generalized cerebral and cerebellar atrophy." ? ?PRECAUTIONS:  Fall ? ?SUBJECTIVE: Doing good today. No pain, no new falls  ? ?PAIN:  ?Are you having pain? No ? ? ? ? ?TODAY'S TREATMENT:  ?03/06/22 ?Nu step 4 min lv 2 dynamic warm up and limb sequencing (seat 9) ?Heel raises x20 ?Toe raises x20  ?Step taps 6 inch step x20 HHA x1  ?Staggered stance 3 x 30"  ?NBOS on foam eyes open/ eyes closed 3 x 30" each  ?NBOS on foam with head turns x20   ?Sidestepping 3 RT  ?Wall bumps 2 x 10  ?Sit to stand x10  ?  ?03/03/22 ?Heel raises x20 ?Toe raises x20  ?Marching x20 HHA x1 ?Step taps 6 inch step x20 HHA x1  ?Staggered stance 3 x 30"  ?NBOS on foam eyes open/ eyes closed 3 x 30" each  ?Sidestepping 3 RT  ? ? ? ? ?PATIENT EDUCATION: ?Education details: on exercise for and function  ?Person educated: Patient ?Education method: Explanation and Demonstration ?Education comprehension: verbalized understanding and returned demonstration ? ? ?HOME EXERCISE PROGRAM: ?cervical rotation, sit to stand, sitting posture correction  ? ? PT Short Term Goals - 02/15/22 1030   ? ?  ? PT SHORT TERM GOAL #1  ? Title Patient  will be independent with initial HEP and self-management strategies to improve functional outcomes   ? Time 3   ? Period Weeks   ? Status On-going   ? Target Date 03/02/22   ?  ? PT SHORT TERM GOAL #2  ? Title Patient will improve DGI by at least 5 points to demo improved balance and reduced risk for falls.   ? Time 3   ? Period Weeks   ? Status On-going   ? Target Date 03/02/22   ? ?  ?  ? ?  ? ? ? PT Long Term Goals - 02/15/22 1030   ? ?  ? PT LONG TERM GOAL #1  ? Title Patient will improve DGI by at least 10 points to demo improved balance and reduced risk for falls.   ? Time 6   ? Period Weeks   ? Status On-going   ? Target Date 03/23/22   ?  ? PT LONG TERM GOAL  #2  ? Title Patient will have equal to or > 4+/5 MMT throughout BLE to improve ability to perform functional mobility, stair ambulation and ADLs.   ? Time 6   ? Period Weeks   ? Status On-going   ? Target Date 03/23/22   ?  ? PT LONG TERM GOAL #3  ? Title Patient will demo correct use of LRAD when ambulation for reduced risk for future falls.   ? Time 6   ? Period Weeks   ? Status On-going   ? Target Date 03/23/22   ? ?  ?  ? ?  ? ? ? ? Plan - 03/01/22 0946   ?  ?  Clinical Impression Statement Patient remains challenged with most balance activity. Demos Rt lateral lean with NBOS and tandem stance. Patient well challenged with added wall bump, having difficulty controlling descent into wall. Patient requires frequent cues for upright posturing and for increased step height with step taps for box clearance. Patient will continue to benefit from skilled therapy services to reduce remaining deficits and improve functional ability.  ?  ?  Examination-Activity Limitations Locomotion Level;Stairs;Transfers;Stand   ?  Examination-Participation Restrictions Management consultant;Shop   ?  Rehab Potential Fair   ?  PT Frequency 2x / week   ?  PT Duration 6 weeks   ?  PT Treatment/Interventions ADLs/Self Care Home Management;Aquatic Therapy;Iontophoresis '4mg'$ /ml Dexamethasone;Moist Heat;Stair training;Gait training;DME Instruction;Traction;Canalith Repostioning;Ultrasound;Functional mobility training;Therapeutic activities;Parrafin;Fluidtherapy;Cryotherapy;Electrical Stimulation;Contrast Bath;Therapeutic exercise;Patient/family education;Orthotic Fit/Training;Manual lymph drainage;Compression bandaging;Splinting;Taping;Vasopneumatic Device;Joint Manipulations;Spinal Manipulations;Dry needling;Manual techniques;Neuromuscular re-education;Scar mobilization;Balance training;Vestibular;Passive range of motion;Visual/perceptual remediation/compensation;Energy conservation   ?  PT Next Visit Plan Progress hip  strength and balance as able. Gait training with AD. Gait with turns   ?  ? ? ?9:36 AM, 03/06/22 ?Josue Hector PT DPT  ?Physical Therapist with [place]  ?[facility]  ?[phone] ? ?   ?

## 2022-03-07 ENCOUNTER — Encounter: Payer: Self-pay | Admitting: Vascular Surgery

## 2022-03-07 ENCOUNTER — Ambulatory Visit (INDEPENDENT_AMBULATORY_CARE_PROVIDER_SITE_OTHER): Payer: Medicare HMO | Admitting: Vascular Surgery

## 2022-03-07 DIAGNOSIS — I748 Embolism and thrombosis of other arteries: Secondary | ICD-10-CM

## 2022-03-07 NOTE — Progress Notes (Signed)
I had a lengthy phone discussion with Ryan Mccarty about his echocardiogram and CT scan results. ?I counseled him that the calcified aortic valve may be the culprit for his atheroembolism. ?I counseled him that likely no further treatment will be needed given the lack of aortic stenosis. ?Recommended continued medical therapy for atherosclerotic disease including aspirin 81 mg daily and high intensity statin therapy. ?We reviewed the incidental findings on the CT scan including the 2 nodules in his left lung, and the adrenal findings.  I encouraged him to follow-up with his primary care physician to discuss neck steps and further surveillance for this.  He is understanding.  He can follow-up with me as needed. ? ?Yevonne Aline. Stanford Breed, MD ?[facility] ?Office Phone Number: [phone] ?03/07/2022 4:24 PM ? ?

## 2022-03-08 ENCOUNTER — Other Ambulatory Visit: Payer: Self-pay

## 2022-03-08 ENCOUNTER — Encounter (HOSPITAL_COMMUNITY): Payer: Self-pay | Admitting: Physical Therapy

## 2022-03-08 ENCOUNTER — Ambulatory Visit (HOSPITAL_COMMUNITY): Payer: Medicare HMO | Admitting: Physical Therapy

## 2022-03-08 DIAGNOSIS — R296 Repeated falls: Secondary | ICD-10-CM

## 2022-03-08 DIAGNOSIS — R42 Dizziness and giddiness: Secondary | ICD-10-CM | POA: Diagnosis not present

## 2022-03-08 DIAGNOSIS — R262 Difficulty in walking, not elsewhere classified: Secondary | ICD-10-CM | POA: Diagnosis not present

## 2022-03-08 NOTE — Therapy (Signed)
?OUTPATIENT PHYSICAL THERAPY TREATMENT NOTE ? ? ?Patient Name: Ryan Mccarty ?MRN: [id number] ?DOB:[birth year], 71 y.o., male ?Today's Date: 03/08/2022 ? ?PCP: Bonnita Hollow, MD ?Elmon Kirschner, MD  ? ? PT End of Session - 03/08/22 1761   ? ? Visit Number 6   ? Number of Visits 12   ? Date for PT Re-Evaluation 03/23/22   ? Authorization Type [employer]   ? Authorization Time Period 12 approved 2/23-03/23/22   ? Authorization - Visit Number 6   ? Authorization - Number of Visits 12   ? Progress Note Due on Visit 10   ? PT Start Time [phone]   ? PT Stop Time 1028   ? PT Time Calculation (min) 38 min   ? Equipment Utilized During Treatment Gait belt   ? Activity Tolerance Patient tolerated treatment well;Patient limited by fatigue   ? Behavior During Therapy [facility] for tasks assessed/performed   ? ?  ?  ? ?  ? ? ?Past Medical History:  ?Diagnosis Date  ? Diabetes mellitus without complication ([place])   ? ?Past Surgical History:  ?Procedure Laterality Date  ? CYSTECTOMY    ? TONSILLECTOMY    ? ?Patient Active Problem List  ? Diagnosis Date Noted  ? 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 Other elevated white blood cell count 01/21/2020  ? Stage 3a chronic kidney disease ([place]) 07/16/2019  ? Type 2 diabetes mellitus ([place]) 07/16/2019  ? Acute renal failure syndrome ([place]) 01/15/2019  ? Arrhythmia 01/15/2019  ? Essential hypertension 01/15/2019  ? Hyperkalemia 01/15/2019  ? Mixed hyperlipidemia 01/15/2019  ? Vitamin D deficiency 01/15/2019  ? ? ?REFERRING DIAG: dizziness  ? ?THERAPY DIAG:  ?Dizziness and giddiness ? ?Difficulty in walking, not elsewhere classified ? ?Repeated falls ? ?PERTINENT HISTORY: Diagnostic imaging (MRI brain): "IMPRESSION: ?Mildly motion degraded exam. ?  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 ?  ?Background chronic small vessel ischemic changes which are  advanced ?in the cerebral white matter, and mild in the pons. ?  ?Prominent perivascular space versus chronic lacunar infarct within ?the left thalamus. ?  ?Mild generalized cerebral and cerebellar atrophy." ? ?PRECAUTIONS:  Fall ? ?SUBJECTIVE: Patient states he is tired today and his feet are sore.  ? ?PAIN:  ?Are you having pain? No ? ? ? ? ?TODAY'S TREATMENT:  ?03/08/22 ?Heel raises x20 ?Toe raises x20  ?Step taps 6 inch step x20 HHA x1  ?Staggered stance 2 x 30"  ?NBOS on foam with head turns x20   ?Sit to stand x10   ?Rocker board (PF/ DF) 3 min  ?Gait with cane 1 RT in clinic  ? ?03/06/22 ?Nu step 4 min lv 2 dynamic warm up and limb sequencing (seat 9) ?Heel raises x20 ?Toe raises x20  ?Step taps 6 inch step x20 HHA x1  ?Staggered stance 3 x 30"  ?NBOS on foam eyes open/ eyes closed 3 x 30" each  ?NBOS on foam with head turns x20   ?Sidestepping 3 RT  ?Wall bumps 2 x 10  ?Sit to stand x10  ?  ?03/03/22 ?Heel raises x20 ?Toe raises x20  ?Marching x20 HHA x1 ?Step taps 6 inch step x20 HHA x1  ?Staggered stance 3 x 30"  ?NBOS on foam eyes open/ eyes closed 3 x 30" each  ?Sidestepping 3 RT  ? ? ? ? ?PATIENT EDUCATION: ?Education details: on exercise  for and function  ?Person educated: Patient ?Education method: Explanation and Demonstration ?Education comprehension: verbalized understanding and returned demonstration ? ? ?HOME EXERCISE PROGRAM: ?cervical rotation, sit to stand, sitting posture correction  ? ? PT Short Term Goals - 02/15/22 1030   ? ?  ? PT SHORT TERM GOAL #1  ? Title Patient will be independent with initial HEP and self-management strategies to improve functional outcomes   ? Time 3   ? Period Weeks   ? Status On-going   ? Target Date 03/02/22   ?  ? PT SHORT TERM GOAL #2  ? Title Patient will improve DGI by at least 5 points to demo improved balance and reduced risk for falls.   ? Time 3   ? Period Weeks   ? Status On-going   ? Target Date 03/02/22   ? ?  ?  ? ?  ? ? ? PT Long Term Goals - 02/15/22  1030   ? ?  ? PT LONG TERM GOAL #1  ? Title Patient will improve DGI by at least 10 points to demo improved balance and reduced risk for falls.   ? Time 6   ? Period Weeks   ? Status On-going   ? Target Date 03/23/22   ?  ? PT LONG TERM GOAL #2  ? Title Patient will have equal to or > 4+/5 MMT throughout BLE to improve ability to perform functional mobility, stair ambulation and ADLs.   ? Time 6   ? Period Weeks   ? Status On-going   ? Target Date 03/23/22   ?  ? PT LONG TERM GOAL #3  ? Title Patient will demo correct use of LRAD when ambulation for reduced risk for future falls.   ? Time 6   ? Period Weeks   ? Status On-going   ? Target Date 03/23/22   ? ?  ?  ? ?  ? ? ? ? Plan - 03/01/22 0946   ?  ?  Clinical Impression Statement Continued work with balance. Patient required verbal cues for increased step height for box clearance with step taps. Patient demos LOB to RT with static balance activity. Added gait with head turns for dynamic balance. Added rocker board for challenge to proprioception. Patient will continue to benefit from skilled therapy services to reduce remaining deficits and improve functional ability.  ?  ?  Examination-Activity Limitations Locomotion Level;Stairs;Transfers;Stand   ?  Examination-Participation Restrictions Management consultant;Shop   ?  Rehab Potential Fair   ?  PT Frequency 2x / week   ?  PT Duration 6 weeks   ?  PT Treatment/Interventions ADLs/Self Care Home Management;Aquatic Therapy;Iontophoresis '4mg'$ /ml Dexamethasone;Moist Heat;Stair training;Gait training;DME Instruction;Traction;Canalith Repostioning;Ultrasound;Functional mobility training;Therapeutic activities;Parrafin;Fluidtherapy;Cryotherapy;Electrical Stimulation;Contrast Bath;Therapeutic exercise;Patient/family education;Orthotic Fit/Training;Manual lymph drainage;Compression bandaging;Splinting;Taping;Vasopneumatic Device;Joint Manipulations;Spinal Manipulations;Dry needling;Manual  techniques;Neuromuscular re-education;Scar mobilization;Balance training;Vestibular;Passive range of motion;Visual/perceptual remediation/compensation;Energy conservation   ?  PT Next Visit Plan Progress hip strength and balance as able. Continue gait training with AD.   ?  ? ? ?10:24 AM, 03/08/22 ?Josue Hector PT DPT  ?Physical Therapist with [place]  ?[facility]  ?[phone] ? ?   ?

## 2022-03-09 DIAGNOSIS — K8020 Calculus of gallbladder without cholecystitis without obstruction: Secondary | ICD-10-CM | POA: Diagnosis not present

## 2022-03-09 DIAGNOSIS — I2510 Atherosclerotic heart disease of native coronary artery without angina pectoris: Secondary | ICD-10-CM | POA: Diagnosis not present

## 2022-03-09 DIAGNOSIS — E785 Hyperlipidemia, unspecified: Secondary | ICD-10-CM | POA: Diagnosis not present

## 2022-03-09 DIAGNOSIS — I498 Other specified cardiac arrhythmias: Secondary | ICD-10-CM | POA: Diagnosis not present

## 2022-03-09 DIAGNOSIS — D7289 Other specified disorders of white blood cells: Secondary | ICD-10-CM | POA: Diagnosis not present

## 2022-03-09 DIAGNOSIS — J8410 Pulmonary fibrosis, unspecified: Secondary | ICD-10-CM | POA: Diagnosis not present

## 2022-03-09 DIAGNOSIS — D47Z9 Other specified neoplasms of uncertain behavior of lymphoid, hematopoietic and related tissue: Secondary | ICD-10-CM | POA: Diagnosis not present

## 2022-03-09 DIAGNOSIS — D72829 Elevated white blood cell count, unspecified: Secondary | ICD-10-CM | POA: Diagnosis not present

## 2022-03-09 DIAGNOSIS — J432 Centrilobular emphysema: Secondary | ICD-10-CM | POA: Diagnosis not present

## 2022-03-09 DIAGNOSIS — E782 Mixed hyperlipidemia: Secondary | ICD-10-CM | POA: Diagnosis not present

## 2022-03-09 DIAGNOSIS — D75838 Other thrombocytosis: Secondary | ICD-10-CM | POA: Diagnosis not present

## 2022-03-09 DIAGNOSIS — E875 Hyperkalemia: Secondary | ICD-10-CM | POA: Diagnosis not present

## 2022-03-09 DIAGNOSIS — R918 Other nonspecific abnormal finding of lung field: Secondary | ICD-10-CM | POA: Diagnosis not present

## 2022-03-09 DIAGNOSIS — K439 Ventral hernia without obstruction or gangrene: Secondary | ICD-10-CM | POA: Diagnosis not present

## 2022-03-09 DIAGNOSIS — J439 Emphysema, unspecified: Secondary | ICD-10-CM | POA: Diagnosis not present

## 2022-03-09 DIAGNOSIS — F17200 Nicotine dependence, unspecified, uncomplicated: Secondary | ICD-10-CM | POA: Diagnosis not present

## 2022-03-09 DIAGNOSIS — N1831 Chronic kidney disease, stage 3a: Secondary | ICD-10-CM | POA: Diagnosis not present

## 2022-03-09 DIAGNOSIS — I700 Atherosclerosis of aorta: Secondary | ICD-10-CM | POA: Diagnosis not present

## 2022-03-09 DIAGNOSIS — N2889 Other specified disorders of kidney and ureter: Secondary | ICD-10-CM | POA: Diagnosis not present

## 2022-03-09 DIAGNOSIS — N179 Acute kidney failure, unspecified: Secondary | ICD-10-CM | POA: Diagnosis not present

## 2022-03-09 DIAGNOSIS — D72828 Other elevated white blood cell count: Secondary | ICD-10-CM | POA: Diagnosis not present

## 2022-03-09 DIAGNOSIS — K7689 Other specified diseases of liver: Secondary | ICD-10-CM | POA: Diagnosis not present

## 2022-03-09 DIAGNOSIS — E278 Other specified disorders of adrenal gland: Secondary | ICD-10-CM | POA: Diagnosis not present

## 2022-03-13 ENCOUNTER — Other Ambulatory Visit: Payer: Self-pay

## 2022-03-13 ENCOUNTER — Ambulatory Visit (HOSPITAL_COMMUNITY): Payer: Medicare HMO | Admitting: Physical Therapy

## 2022-03-13 DIAGNOSIS — R262 Difficulty in walking, not elsewhere classified: Secondary | ICD-10-CM | POA: Diagnosis not present

## 2022-03-13 DIAGNOSIS — R296 Repeated falls: Secondary | ICD-10-CM | POA: Diagnosis not present

## 2022-03-13 DIAGNOSIS — R42 Dizziness and giddiness: Secondary | ICD-10-CM

## 2022-03-13 NOTE — Therapy (Signed)
?OUTPATIENT PHYSICAL THERAPY TREATMENT NOTE ? ? ?Patient Name: Ryan Mccarty ?MRN: [id number] ?DOB:1951/07/04, 71 y.o., male ?Today's Date: 03/13/2022 ? ?PCP: Bonnita Hollow, MD ?Elmon Kirschner, MD  ? ? PT End of Session - 03/13/22 1009   ? ? Visit Number 7   ? Number of Visits 12   ? Date for PT Re-Evaluation 03/23/22   ? Authorization Type [employer]   ? Authorization Time Period 12 approved 2/23-03/23/22   ? Authorization - Visit Number 7   ? Authorization - Number of Visits 12   ? Progress Note Due on Visit 10   ? PT Start Time 1008   ? PT Stop Time 1048   ? PT Time Calculation (min) 40 min   ? Equipment Utilized During Treatment Gait belt   ? Activity Tolerance Patient tolerated treatment well;Patient limited by fatigue   ? Behavior During Therapy [facility] for tasks assessed/performed   ? ?  ?  ? ?  ? ? ?Past Medical History:  ?Diagnosis Date  ? Diabetes mellitus without complication ([place])   ? ?Past Surgical History:  ?Procedure Laterality Date  ? CYSTECTOMY    ? TONSILLECTOMY    ? ?Patient Active Problem List  ? Diagnosis Date Noted  ? 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 Other elevated white blood cell count 01/21/2020  ? Stage 3a chronic kidney disease ([place]) 07/16/2019  ? Type 2 diabetes mellitus ([place]) 07/16/2019  ? Acute renal failure syndrome ([place]) 01/15/2019  ? Arrhythmia 01/15/2019  ? Essential hypertension 01/15/2019  ? Hyperkalemia 01/15/2019  ? Mixed hyperlipidemia 01/15/2019  ? Vitamin D deficiency 01/15/2019  ? ? ?REFERRING DIAG: dizziness  ? ?THERAPY DIAG:  ?Dizziness and giddiness ? ?Difficulty in walking, not elsewhere classified ? ?Repeated falls ? ?PERTINENT HISTORY: Diagnostic imaging (MRI brain): "IMPRESSION: ?Mildly motion degraded exam. ?  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 ?  ?Background chronic small vessel ischemic changes which are  advanced ?in the cerebral white matter, and mild in the pons. ?  ?Prominent perivascular space versus chronic lacunar infarct within ?the left thalamus. ?  ?Mild generalized cerebral and cerebellar atrophy." ? ?PRECAUTIONS:  Fall ? ?SUBJECTIVE: PAIN:   Pt states he spent Friday at the cancer center.  States he has a meeting with them later this week but they found a spot on his Lt lung and 2 on his kidney.  ? ?Are you having pain? No ? ? ? ? ?TODAY'S TREATMENT:  ?           03/13/22 ?          Standing:  Good posture at wall hold x 2' ?                            Staying in good posture head turns x 5 ?                            Standing in good posture tandem stance x 3 B  ?                            Marching x 10  ?  Side stepping x 3  ?                            Functional squat to heel raise with med ball yellow x 10         (baketballshot) ?                            Toe tap x 10 onto 6" step  ?                             Heel toe gait x 2 reps  ?                             Retro gt x 2 rep  ?           Sitting:      Scapular retraction x 10 reps  ?                            Sit to stand x 10  ?03/08/22 ?Heel raises x20 ?Toe raises x20  ?Step taps 6 inch step x20 HHA x1  ?Staggered stance 2 x 30"  ?NBOS on foam with head turns x20   ?Sit to stand x10   ?Rocker board (PF/ DF) 3 min  ?Gait with cane 1 RT in clinic  ? ?03/06/22 ?Nu step 4 min lv 2 dynamic warm up and limb sequencing (seat 9) ?Heel raises x20 ?Toe raises x20  ?Step taps 6 inch step x20 HHA x1  ?Staggered stance 3 x 30"  ?NBOS on foam eyes open/ eyes closed 3 x 30" each  ?NBOS on foam with head turns x20   ?Sidestepping 3 RT  ?Wall bumps 2 x 10  ?Sit to stand x10  ?  ?03/03/22 ?Heel raises x20 ?Toe raises x20  ?Marching x20 HHA x1 ?Step taps 6 inch step x20 HHA x1  ?Staggered stance 3 x 30"  ?NBOS on foam eyes open/ eyes closed 3 x 30" each  ?Sidestepping 3 RT  ? ? ? ? ?PATIENT EDUCATION: ?Education details: on exercise  for and function  ?Person educated: Patient ?Education method: Explanation and Demonstration ?Education comprehension: verbalized understanding and returned demonstration ? ? ?HOME EXERCISE PROGRAM: ?cervical rotation, sit to stand, sitting posture correction  ? ? PT Short Term Goals - 02/15/22 1030   ? ?  ? PT SHORT TERM GOAL #1  ? Title Patient will be independent with initial HEP and self-management strategies to improve functional outcomes   ? Time 3   ? Period Weeks   ? Status On-going   ? Target Date 03/02/22   ?  ? PT SHORT TERM GOAL #2  ? Title Patient will improve DGI by at least 5 points to demo improved balance and reduced risk for falls.   ? Time 3   ? Period Weeks   ? Status On-going   ? Target Date 03/02/22   ? ?  ?  ? ?  ? ? ? PT Long Term Goals - 02/15/22 1030   ? ?  ? PT LONG TERM GOAL #1  ? Title Patient will improve DGI by at least 10 points to demo improved balance and reduced risk for falls.   ? Time 6   ?  Period Weeks   ? Status On-going   ? Target Date 03/23/22   ?  ? PT LONG TERM GOAL #2  ? Title Patient will have equal to or > 4+/5 MMT throughout BLE to improve ability to perform functional mobility, stair ambulation and ADLs.   ? Time 6   ? Period Weeks   ? Status On-going   ? Target Date 03/23/22   ?  ? PT LONG TERM GOAL #3  ? Title Patient will demo correct use of LRAD when ambulation for reduced risk for future falls.   ? Time 6   ? Period Weeks   ? Status On-going   ? Target Date 03/23/22   ? ?  ?  ? ?  ? ? ? ? Plan - 03/01/22 0946   ?  ?  Clinical Impression Statement Todays treatment focused on improving posture and keeping posture while completing  balance activities.  Added tandem gt, sidestepping and retro gait activities.   ?  ?  Examination-Activity Limitations Locomotion Level;Stairs;Transfers;Stand   ?  Examination-Participation Restrictions Management consultant;Shop   ?  Rehab Potential Fair   ?  PT Frequency 2x / week   ?  PT Duration 6 weeks   ?  PT  Treatment/Interventions ADLs/Self Care Home Management;Aquatic Therapy;Iontophoresis '4mg'$ /ml Dexamethasone;Moist Heat;Stair training;Gait training;DME Instruction;Traction;Canalith Repostioning;Ultrasound;Functional mobility training;Therapeutic activities;Parrafin;Fluidtherapy;Cryotherapy;Electrical Stimulation;Contrast Bath;Therapeutic exercise;Patient/family education;Orthotic Fit/Training;Manual lymph drainage;Compression bandaging;Splinting;Taping;Vasopneumatic Device;Joint Manipulations;Spinal Manipulations;Dry needling;Manual techniques;Neuromuscular re-education;Scar mobilization;Balance training;Vestibular;Passive range of motion;Visual/perceptual remediation/compensation;Energy conservation   ?  PT Next Visit Plan Progress hip strength and balance as able. Continue gait training with AD.   ?  ? ? ?10:48AM, 03/13/22 ?Rayetta Humphrey, PT CLT ?[phone]  ?[facility]  ?[phone] ? ?   ?

## 2022-03-15 ENCOUNTER — Other Ambulatory Visit: Payer: Self-pay

## 2022-03-15 ENCOUNTER — Ambulatory Visit (HOSPITAL_COMMUNITY): Payer: Medicare HMO | Admitting: Physical Therapy

## 2022-03-15 DIAGNOSIS — R296 Repeated falls: Secondary | ICD-10-CM

## 2022-03-15 DIAGNOSIS — R262 Difficulty in walking, not elsewhere classified: Secondary | ICD-10-CM

## 2022-03-15 DIAGNOSIS — R42 Dizziness and giddiness: Secondary | ICD-10-CM

## 2022-03-15 NOTE — Therapy (Signed)
?OUTPATIENT PHYSICAL THERAPY TREATMENT NOTE ? ? ?Patient Name: Ryan Mccarty ?MRN: [id number] ?DOB:February 23, 1951, 71 y.o., male ?Today's Date: 03/15/2022 ? ?PCP: Bonnita Hollow, MD ?Elmon Kirschner, MD  ? ? PT End of Session - 03/15/22 1055   ? ? Visit Number 8   ? Number of Visits 12   ? Date for PT Re-Evaluation 03/23/22   ? Authorization Type [employer]   ? Authorization Time Period 12 approved 2/23-03/23/22   ? Authorization - Visit Number 8   ? Authorization - Number of Visits 12   ? Progress Note Due on Visit 10   ? PT Start Time 1100   ? PT Stop Time 1140   ? PT Time Calculation (min) 40 min   ? Equipment Utilized During Treatment Gait belt   ? Activity Tolerance Patient tolerated treatment well;Patient limited by fatigue   ? Behavior During Therapy [facility] for tasks assessed/performed   ? ?  ?  ? ?  ? ? ?Past Medical History:  ?Diagnosis Date  ? Diabetes mellitus without complication ([place])   ? ?Past Surgical History:  ?Procedure Laterality Date  ? CYSTECTOMY    ? TONSILLECTOMY    ? ?Patient Active Problem List  ? Diagnosis Date Noted  ? 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 Other elevated white blood cell count 01/21/2020  ? Stage 3a chronic kidney disease ([place]) 07/16/2019  ? Type 2 diabetes mellitus ([place]) 07/16/2019  ? Acute renal failure syndrome ([place]) 01/15/2019  ? Arrhythmia 01/15/2019  ? Essential hypertension 01/15/2019  ? Hyperkalemia 01/15/2019  ? Mixed hyperlipidemia 01/15/2019  ? Vitamin D deficiency 01/15/2019  ? ? ?REFERRING DIAG: dizziness  ? ?THERAPY DIAG:  ?Dizziness and giddiness ? ?Difficulty in walking, not elsewhere classified ? ?Repeated falls ? ?PERTINENT HISTORY: Diagnostic imaging (MRI brain): "IMPRESSION: ?Mildly motion degraded exam. ?  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 ?  ?Background chronic small vessel ischemic changes which are  advanced ?in the cerebral white matter, and mild in the pons. ?  ?Prominent perivascular space versus chronic lacunar infarct within ?the left thalamus. ?  ?Mild generalized cerebral and cerebellar atrophy." ? ?PRECAUTIONS:  Fall ? ?SUBJECTIVE:  Pt states right now he is not experiencing any dizziness, mowed the yard on the riding mower yesterday.  Legs were sore following the mowing  ? ? ?Are you having pain? No ? ? ? ? ?TODAY'S TREATMENT:  ? ?           03/15/22    rockerboard forward and back as well as RT/Lt x 10 ?Standing in good posture tandem stance x with head turns x 10  ?                            Marching x 10  ?                            Side stepping  with green theraband x 2 ?                            Functional squat to heel raise with med ball yellow x 15         (baketballshot)     toe raises on slope x 10  ?  Toe tap x 10 onto 6" step  ?                             Heel toe gait x 2 reps  ?                             Retro gt x 2 rep  ?                             Stepping over 4" hurdles  forward 3 RT   ?                             Sitting:  sit to stand x 10  ?           03/13/22 ?          Standing:  Good posture at wall hold x 2' ?                            Staying in good posture head turns x 5 ?                            Standing in good posture tandem stance x 3 B  ?                            Marching x 10  ?                            Side stepping x 3  ?                            Functional squat to heel raise with med ball yellow x 10         (baketballshot) ?                            Toe tap x 10 onto 6" step  ?                             Heel toe gait x 2 reps  ?                             Retro gt x 2 rep  ?           Sitting:      Scapular retraction x 10 reps  ?                            Sit to stand x 10  ?03/08/22 ?Heel raises x20 ?Toe raises x20  ?Step taps 6 inch step x20 HHA x1  ?Staggered stance 2 x 30"  ?NBOS on foam with head turns x20    ?Sit to stand x10   ?Rocker board (PF/ DF) 3 min  ?Gait with cane 1 RT in clinic  ? ?03/06/22 ?Nu step 4 min lv 2 dynamic  warm up and limb sequencing (seat 9) ?Heel raises x20 ?Toe raises x20  ?Step taps 6 inch step x20 HHA x1  ?Staggered stance 3 x 30"  ?NBOS on foam eyes open/ eyes closed 3 x 30" each  ?NBOS on foam with head turns x20   ?Sidestepping 3 RT  ?Wall bumps 2 x 10  ?Sit to stand x10  ?  ?03/03/22 ?Heel raises x20 ?Toe raises x20  ?Marching x20 HHA x1 ?Step taps 6 inch step x20 HHA x1  ?Staggered stance 3 x 30"  ?NBOS on foam eyes open/ eyes closed 3 x 30" each  ?Sidestepping 3 RT  ? ? ? ? ?PATIENT EDUCATION: ?Education details: on exercise for and function  ?Person educated: Patient ?Education method: Explanation and Demonstration ?Education comprehension: verbalized understanding and returned demonstration ? ? ?HOME EXERCISE PROGRAM: ?cervical rotation, sit to stand, sitting posture correction, sitting hip abduction with green tband x 10  ? ? PT Short Term Goals - 02/15/22 1030   ? ?  ? PT SHORT TERM GOAL #1  ? Title Patient will be independent with initial HEP and self-management strategies to improve functional outcomes   ? Time 3   ? Period Weeks   ? Status On-going   ? Target Date 03/02/22   ?  ? PT SHORT TERM GOAL #2  ? Title Patient will improve DGI by at least 5 points to demo improved balance and reduced risk for falls.   ? Time 3   ? Period Weeks   ? Status On-going   ? Target Date 03/02/22   ? ?  ?  ? ?  ? ? ? PT Long Term Goals - 02/15/22 1030   ? ?  ? PT LONG TERM GOAL #1  ? Title Patient will improve DGI by at least 10 points to demo improved balance and reduced risk for falls.   ? Time 6   ? Period Weeks   ? Status On-going   ? Target Date 03/23/22   ?  ? PT LONG TERM GOAL #2  ? Title Patient will have equal to or > 4+/5 MMT throughout BLE to improve ability to perform functional mobility, stair ambulation and ADLs.   ? Time 6   ? Period Weeks   ? Status On-going   ? Target Date 03/23/22    ?  ? PT LONG TERM GOAL #3  ? Title Patient will demo correct use of LRAD when ambulation for reduced risk for future falls.   ? Time 6   ? Period Weeks   ? Status On-going   ? Target Date 03/23/22   ? ?  ?  ? ?  ? ? ? ? Plan - 03/01/22 0946   ?  ?  Clinical Impression Statement Pt able to complete all exercises with increased ease today.   Increased resistance of side step, added 4" hurdles with  tandem gt exercises.  Added toe raises to improve anterior tibialis strength.    ?  ?  Examination-Activity Limitations Locomotion Level;Stairs;Transfers;Stand   ?  Examination-Participation Restrictions Management consultant;Shop   ?  Rehab Potential Fair   ?  PT Frequency 2x / week   ?  PT Duration 6 weeks   ?  PT Treatment/Interventions ADLs/Self Care Home Management;Aquatic Therapy;Iontophoresis '4mg'$ /ml Dexamethasone;Moist Heat;Stair training;Gait training;DME Instruction;Traction;Canalith Repostioning;Ultrasound;Functional mobility training;Therapeutic activities;Parrafin;Fluidtherapy;Cryotherapy;Electrical Stimulation;Contrast Bath;Therapeutic exercise;Patient/family education;Orthotic Fit/Training;Manual lymph drainage;Compression bandaging;Splinting;Taping;Vasopneumatic Device;Joint Manipulations;Spinal Manipulations;Dry needling;Manual techniques;Neuromuscular re-education;Scar mobilization;Balance training;Vestibular;Passive range of motion;Visual/perceptual remediation/compensation;Energy conservation   ?  PT Next Visit Plan Progress hip strength and balance as able. Continue gait training with AD.   ?  ? ? ?10148AM, 03/15/22 ?Rayetta Humphrey, PT CLT ?[phone]  ?[facility]  ?[phone] ? ?   ?

## 2022-03-17 DIAGNOSIS — R911 Solitary pulmonary nodule: Secondary | ICD-10-CM | POA: Diagnosis not present

## 2022-03-17 DIAGNOSIS — D75839 Thrombocytosis, unspecified: Secondary | ICD-10-CM | POA: Diagnosis not present

## 2022-03-17 DIAGNOSIS — D72829 Elevated white blood cell count, unspecified: Secondary | ICD-10-CM | POA: Diagnosis not present

## 2022-03-17 DIAGNOSIS — R918 Other nonspecific abnormal finding of lung field: Secondary | ICD-10-CM | POA: Diagnosis not present

## 2022-03-17 DIAGNOSIS — N2889 Other specified disorders of kidney and ureter: Secondary | ICD-10-CM | POA: Diagnosis not present

## 2022-03-20 ENCOUNTER — Encounter (HOSPITAL_COMMUNITY): Payer: Self-pay | Admitting: Physical Therapy

## 2022-03-20 ENCOUNTER — Ambulatory Visit (HOSPITAL_COMMUNITY): Payer: Medicare HMO | Attending: Otolaryngology | Admitting: Physical Therapy

## 2022-03-20 DIAGNOSIS — R42 Dizziness and giddiness: Secondary | ICD-10-CM | POA: Diagnosis not present

## 2022-03-20 DIAGNOSIS — R296 Repeated falls: Secondary | ICD-10-CM | POA: Diagnosis not present

## 2022-03-20 DIAGNOSIS — R262 Difficulty in walking, not elsewhere classified: Secondary | ICD-10-CM | POA: Diagnosis not present

## 2022-03-20 NOTE — Therapy (Signed)
?OUTPATIENT PHYSICAL THERAPY TREATMENT NOTE ? ? ?Patient Name: Ryan Mccarty ?MRN: [id number] ?DOB:[birth year], 71 y.o., male ?Today's Date: 03/20/2022 ? ?PCP: Bonnita Hollow, MD ?Elmon Kirschner, MD  ? ? PT End of Session - 03/20/22 0951   ? ? Visit Number 9   ? Number of Visits 12   ? Date for PT Re-Evaluation 03/23/22   ? Authorization Type [employer]   ? Authorization Time Period 12 approved 2/23-03/23/22   ? Authorization - Visit Number 9   ? Authorization - Number of Visits 12   ? Progress Note Due on Visit 10   ? PT Start Time [phone]   ? PT Stop Time 1028   ? PT Time Calculation (min) 40 min   ? Equipment Utilized During Treatment Gait belt   ? Activity Tolerance Patient tolerated treatment well   ? Behavior During Therapy [facility] for tasks assessed/performed   ? ?  ?  ? ?  ? ? ?Past Medical History:  ?Diagnosis Date  ? Diabetes mellitus without complication ([place])   ? ?Past Surgical History:  ?Procedure Laterality Date  ? CYSTECTOMY    ? TONSILLECTOMY    ? ?Patient Active Problem List  ? Diagnosis Date Noted  ? 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 Other elevated white blood cell count 01/21/2020  ? Stage 3a chronic kidney disease ([place]) 07/16/2019  ? Type 2 diabetes mellitus ([place]) 07/16/2019  ? Acute renal failure syndrome ([place]) 01/15/2019  ? Arrhythmia 01/15/2019  ? Essential hypertension 01/15/2019  ? Hyperkalemia 01/15/2019  ? Mixed hyperlipidemia 01/15/2019  ? Vitamin D deficiency 01/15/2019  ? ? ?REFERRING DIAG: dizziness  ? ?THERAPY DIAG:  ?Dizziness and giddiness ? ?Difficulty in walking, not elsewhere classified ? ?Repeated falls ? ?PERTINENT HISTORY: Diagnostic imaging (MRI brain): "IMPRESSION: ?Mildly motion degraded exam. ?  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 ?  ?Background chronic small vessel ischemic changes which are advanced ?in the cerebral white matter,  and mild in the pons. ?  ?Prominent perivascular space versus chronic lacunar infarct within ?the left thalamus. ?  ?Mild generalized cerebral and cerebellar atrophy." ? ?PRECAUTIONS:  Fall ? ?SUBJECTIVE:  Patient says he is sore today he fell yesterday trying to sit in a chair. His RT hip is sore today. He still is not using an assistive device as previously recommended.  ? ?PAIN:  ?Are you having pain? Yes: NPRS scale: 5/10 ?Pain location: RT hip ?Pain description: sore  ?Aggravating factors: WB ?Relieving factors: rest  ? ? ? ? ?TODAY'S TREATMENT:  ? ?           03/20/22 ?Rec bike dynamic warmup for ROM and sequencing 4 min  ?Standing heel/ toe raises x 20 each ?Rocker board PF/DF 3 min  ?Tandem stance on foam 3 x 20"  ?Step over hurdles 4 inch 4 RT  ?Sidestep over hurdles 4 inch 4RT  ?Forward reach (testing anterior bounds of COG) 4 min, in parallell bars reaching toward cones  ? ? ?03/15/22    rockerboard forward and back as well as RT/Lt x 10 ?Standing in good posture tandem stance x with head turns x 10  ?                            Marching x 10  ?  Side stepping  with green theraband x 2 ?                            Functional squat to heel raise with med ball yellow x 15         (baketballshot)     toe raises on slope x 10  ?                            Toe tap x 10 onto 6" step  ?                             Heel toe gait x 2 reps  ?                             Retro gt x 2 rep  ?                             Stepping over 4" hurdles  forward 3 RT   ?                             Sitting:  sit to stand x 10  ?           03/13/22 ?          Standing:  Good posture at wall hold x 2' ?                            Staying in good posture head turns x 5 ?                            Standing in good posture tandem stance x 3 B  ?                            Marching x 10  ?                            Side stepping x 3  ?                            Functional squat to heel raise with med ball  yellow x 10         (baketballshot) ?                            Toe tap x 10 onto 6" step  ?                             Heel toe gait x 2 reps  ?                             Retro gt x 2 rep  ?           Sitting:      Scapular retraction x 10 reps  ?  Sit to stand x 10  ? ? ? ? ?PATIENT EDUCATION: ?Education details: on exercise for and function  ?Person educated: Patient ?Education method: Explanation and Demonstration ?Education comprehension: verbalized understanding and returned demonstration ? ? ?HOME EXERCISE PROGRAM: ?cervical rotation, sit to stand, sitting posture correction, sitting hip abduction with green tband x 10  ? ? PT Short Term Goals - 02/15/22 1030   ? ?  ? PT SHORT TERM GOAL #1  ? Title Patient will be independent with initial HEP and self-management strategies to improve functional outcomes   ? Time 3   ? Period Weeks   ? Status On-going   ? Target Date 03/02/22   ?  ? PT SHORT TERM GOAL #2  ? Title Patient will improve DGI by at least 5 points to demo improved balance and reduced risk for falls.   ? Time 3   ? Period Weeks   ? Status On-going   ? Target Date 03/02/22   ? ?  ?  ? ?  ? ? ? PT Long Term Goals - 02/15/22 1030   ? ?  ? PT LONG TERM GOAL #1  ? Title Patient will improve DGI by at least 10 points to demo improved balance and reduced risk for falls.   ? Time 6   ? Period Weeks   ? Status On-going   ? Target Date 03/23/22   ?  ? PT LONG TERM GOAL #2  ? Title Patient will have equal to or > 4+/5 MMT throughout BLE to improve ability to perform functional mobility, stair ambulation and ADLs.   ? Time 6   ? Period Weeks   ? Status On-going   ? Target Date 03/23/22   ?  ? PT LONG TERM GOAL #3  ? Title Patient will demo correct use of LRAD when ambulation for reduced risk for future falls.   ? Time 6   ? Period Weeks   ? Status On-going   ? Target Date 03/23/22   ? ?  ?  ? ?  ? ? ? ? Plan - 03/01/22 0946   ?  ?  Clinical Impression Statement Patient did very well  with ambulating over hurdles, no LOB. Added forward reach cone grab to outer limit of center of gravity. Patient did very well with this also. He continues to demo small step gait and reports that imbalance seems spontaneous. Reiterated importance of AD for reduced falls. Patient will continue to benefit from skilled therapy services to reduce remaining deficits and improve functional ability.  ? ?  ?  Examination-Activity Limitations Locomotion Level;Stairs;Transfers;Stand   ?  Examination-Participation Restrictions Management consultant;Shop   ?  Rehab Potential Fair   ?  PT Frequency 2x / week   ?  PT Duration 6 weeks   ?  PT Treatment/Interventions ADLs/Self Care Home Management;Aquatic Therapy;Iontophoresis '4mg'$ /ml Dexamethasone;Moist Heat;Stair training;Gait training;DME Instruction;Traction;Canalith Repostioning;Ultrasound;Functional mobility training;Therapeutic activities;Parrafin;Fluidtherapy;Cryotherapy;Electrical Stimulation;Contrast Bath;Therapeutic exercise;Patient/family education;Orthotic Fit/Training;Manual lymph drainage;Compression bandaging;Splinting;Taping;Vasopneumatic Device;Joint Manipulations;Spinal Manipulations;Dry needling;Manual techniques;Neuromuscular re-education;Scar mobilization;Balance training;Vestibular;Passive range of motion;Visual/perceptual remediation/compensation;Energy conservation   ?  PT Next Visit Plan Progress hip strength and balance as able. Continue gait training with AD.   ?  ? ?10:29 AM, 03/20/22 ?Josue Hector PT DPT  ?Physical Therapist with [place]  ?[facility]  ?[phone] ? ?

## 2022-03-21 DIAGNOSIS — D47Z9 Other specified neoplasms of uncertain behavior of lymphoid, hematopoietic and related tissue: Secondary | ICD-10-CM | POA: Diagnosis not present

## 2022-03-21 DIAGNOSIS — D72829 Elevated white blood cell count, unspecified: Secondary | ICD-10-CM | POA: Diagnosis not present

## 2022-03-21 DIAGNOSIS — E782 Mixed hyperlipidemia: Secondary | ICD-10-CM | POA: Diagnosis not present

## 2022-03-21 DIAGNOSIS — E875 Hyperkalemia: Secondary | ICD-10-CM | POA: Diagnosis not present

## 2022-03-21 DIAGNOSIS — D72828 Other elevated white blood cell count: Secondary | ICD-10-CM | POA: Diagnosis not present

## 2022-03-21 DIAGNOSIS — I498 Other specified cardiac arrhythmias: Secondary | ICD-10-CM | POA: Diagnosis not present

## 2022-03-21 DIAGNOSIS — N1831 Chronic kidney disease, stage 3a: Secondary | ICD-10-CM | POA: Diagnosis not present

## 2022-03-21 DIAGNOSIS — N179 Acute kidney failure, unspecified: Secondary | ICD-10-CM | POA: Diagnosis not present

## 2022-03-22 DIAGNOSIS — D72829 Elevated white blood cell count, unspecified: Secondary | ICD-10-CM | POA: Diagnosis not present

## 2022-03-23 ENCOUNTER — Ambulatory Visit (HOSPITAL_COMMUNITY): Payer: Medicare HMO | Admitting: Physical Therapy

## 2022-03-23 DIAGNOSIS — R262 Difficulty in walking, not elsewhere classified: Secondary | ICD-10-CM | POA: Diagnosis not present

## 2022-03-23 DIAGNOSIS — R296 Repeated falls: Secondary | ICD-10-CM | POA: Diagnosis not present

## 2022-03-23 DIAGNOSIS — R42 Dizziness and giddiness: Secondary | ICD-10-CM | POA: Diagnosis not present

## 2022-03-23 NOTE — Therapy (Signed)
?OUTPATIENT PHYSICAL THERAPY TREATMENT NOTE ? ? ?Patient Name: Ryan Mccarty ?MRN: [id number] ?DOB:April 11, 1951, 71 y.o., male ?Today's Date: 03/23/2022 ? ?PCP: Bonnita Hollow, MD ?Elmon Kirschner, MD  ? ? ?Progress Note ?Reporting Period 02/09/2022  to 03/23/2022 ? ?See note below for Objective Data and Assessment of Progress/Goals.  ? ?  ? PT End of Session - 03/23/22 1008   ? ? Visit Number 10   ? Number of Visits 22  ? Date for PT Re-Evaluation 05/04/2022 ?  ? Authorization Type [employer]   ? Authorization Time Period 12 approved 2/23-03/23/22 ; requested more visits.   ? Authorization - Visit Number 10   ? Authorization - Number of Visits 12   ? Progress Note Due on Visit 10   ? PT Start Time 1010   ? PT Stop Time 1045   ? PT Time Calculation (min) 35 min   ? ?  ?  ? ?  ? ? ?Past Medical History:  ?Diagnosis Date  ? Diabetes mellitus without complication ([place])   ? ?Past Surgical History:  ?Procedure Laterality Date  ? CYSTECTOMY    ? TONSILLECTOMY    ? ?Patient Active Problem List  ? Diagnosis Date Noted  ? 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 Other elevated white blood cell count 01/21/2020  ? Stage 3a chronic kidney disease ([place]) 07/16/2019  ? Type 2 diabetes mellitus ([place]) 07/16/2019  ? Acute renal failure syndrome ([place]) 01/15/2019  ? Arrhythmia 01/15/2019  ? Essential hypertension 01/15/2019  ? Hyperkalemia 01/15/2019  ? Mixed hyperlipidemia 01/15/2019  ? Vitamin D deficiency 01/15/2019  ? ? ?REFERRING DIAG: dizziness  ? ?THERAPY DIAG:  ?Dizziness and giddiness ? ?Difficulty in walking, not elsewhere classified ? ?Repeated falls ? ?PERTINENT HISTORY: Diagnostic imaging (MRI brain): "IMPRESSION: ?Mildly motion degraded exam. ?  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 ?  ?Background chronic small vessel ischemic changes which are advanced ?in the cerebral white matter,  and mild in the pons. ?  ?Prominent perivascular space versus chronic lacunar infarct within ?the left thalamus. ?  ?Mild generalized cerebral and cerebellar atrophy." ? ?PRECAUTIONS:  Fall ?03/23/2022 ?SUBJECTIVE:  ?PAIN:  Pt states that his dizziness comes and goes, he fell on Sunday.  PT states that his ENTMD checked for crystals and did not think that this was the problem.  ?Are you having pain? Yes: NPRS scale: 5/10 ?Pain location: feet  ?Pain description: sore  ?Aggravating factors: WB ?Relieving factors: rest  ? ? ?Dizziness is a 4/10  ?    ?  Observation/Other Assessments  ?  Focus on Therapeutic Outcomes (FOTO)  63% functioning was 54% function   ?     ?  ROM / Strength  ?  AROM / PROM / Strength Strength   ?     ?  Strength  ?  Strength Assessment Site Hip;Knee;Ankle   ?  Right/Left Hip Right;Left   ?  Right Hip Flexion 5/5   ?  Left Hip Flexion 5/5   ?  Right/Left Knee Right;Left   ?  Right Knee Extension 4+ was 4/5   ?  Left Knee Extension 5/5 was 4/5   ?  Right/Left Ankle Right;Left   ?  Right Ankle Dorsiflexion 4+/5  was 4+/5  ?  Left Ankle Dorsiflexion 4+/5  was 4+/5   ?     ?  Ambulation/Gait  ?  Ambulation/Gait Yes   ?  Ambulation/Gait Assistance 4: Min assist   ?  Assistive device None   ?  Gait Pattern Decreased step length - right;Decreased step length - left;Decreased stride length;Decreased dorsiflexion - right;Decreased dorsiflexion - left;Shuffle   ?  Ambulation Surface Level;Indoor   ?     ?  Standardized Balance Assessment  ?  Standardized Balance Assessment Dynamic Gait Index   ?     ?  Dynamic Gait Index  ?  Level Surface Moderate Impairment   ?  Change in Gait Speed minumumImpairment   ?  Gait with Horizontal Head Turns Moderate Impairment   ?  Gait with Vertical Head Turns Moderate Impairment   ?  Gait and Pivot Turn Moderate Impairment   ?  Step Over Obstacle Moderate Impairment   ?  Step Around Obstacles Moderate Impairment   ?  Steps Minimal Impairment   ?  Total Score 10 was 7   ? Gt  pt walks flat footed tripping on his own feet when ambulating, of interest as pt increases speed  ?Pt gait improves.  ?   ?  ?Cervical and scapular retraction x 10 each ?AROM:  Cervical ROM:  Rt rotation 55; Lt 70  ?    ?TODAY'S TREATMENT:  ? ?           03/20/22 ?Rec bike dynamic warmup for ROM and sequencing 4 min  ?Standing heel/ toe raises x 20 each ?Rocker board PF/DF 3 min  ?Tandem stance on foam 3 x 20"  ?Step over hurdles 4 inch 4 RT  ?Sidestep over hurdles 4 inch 4RT  ?Forward reach (testing anterior bounds of COG) 4 min, in parallell bars reaching toward cones  ? ? ?03/15/22    rockerboard forward and back as well as RT/Lt x 10 ?Standing in good posture tandem stance x with head turns x 10  ?                            Marching x 10  ?                            Side stepping  with green theraband x 2 ?                            Functional squat to heel raise with med ball yellow x 15         (baketballshot)     toe raises on slope x 10  ?                            Toe tap x 10 onto 6" step  ?                             Heel toe gait x 2 reps  ?                             Retro gt x 2 rep  ?                             Stepping over 4" hurdles  forward 3 RT   ?  Sitting:  sit to stand x 10  ?           03/13/22 ?          Standing:  Good posture at wall hold x 2' ?                            Staying in good posture head turns x 5 ?                            Standing in good posture tandem stance x 3 B  ?                            Marching x 10  ?                            Side stepping x 3  ?                            Functional squat to heel raise with med ball yellow x 10         (baketballshot) ?                            Toe tap x 10 onto 6" step  ?                             Heel toe gait x 2 reps  ?                             Retro gt x 2 rep  ?           Sitting:      Scapular retraction x 10 reps  ?                            Sit to stand x 10  ? ? ? ? ?PATIENT  EDUCATION: ?Education details: on exercise for and function  ?Person educated: Patient ?Education method: Explanation and Demonstration ?Education comprehension: verbalized understanding and returned demonstration ? ? ?HOME EXERCISE PROGRAM: ?cervical rotation, sit to stand, sitting posture correction, sitting hip abduction with green tband x 10  ? ? PT Short Term Goals - 02/15/22 1030   ? ?  ? PT SHORT TERM GOAL #1  ? Title Patient will be independent with initial HEP and self-management strategies to improve functional outcomes   ? Time 3   ? Period Weeks   ? Status On-going   ? Target Date 03/02/22   ?  ? PT SHORT TERM GOAL #2  ? Title Patient will improve DGI by at least 5 points to demo improved balance and reduced risk for falls.   ? Time 3   ? Period Weeks   ? Status On-going   ? Target Date 03/02/22   ? ?  ?  ? ?  ? ? ? PT Long Term Goals - 02/15/22 1030   ? ?  ? PT LONG TERM GOAL #1  ? Title Patient will improve DGI by at least 10 points to demo improved balance and  reduced risk for falls.   ? Time 6   ? Period Weeks   ? Status On-going   ? Target Date 03/23/22   ?  ? PT LONG TERM GOAL #2  ? Title Patient will have equal to or > 4+/5 MMT throughout BLE to improve ability to perform functional mobility, stair ambulation and ADLs.   ? Time 6   ? Period Weeks   ? Status Met   ? Target Date 03/23/22   ?  ? PT LONG TERM GOAL #3  ? Title Patient will demo correct use of LRAD when ambulation for reduced risk for future falls.   ? Time 6   ? Period Weeks   ? Status N/A  ? Target Date 03/23/22   ? ?  ?  ? ?  ? ? ? ? Plan - 03/01/22 0946   ?  ?  Clinical Impression Statement PT reassessed today.  He has improved in all aspects but continues to have significant gait and balance deficits and continues to fall.Pt walks flat footed tripping on  his own feet when ambulating, of interest as pt increases speed gait improves.  PT vocalized that most of the time he is falling he is bending forward.   ?  ?   Examination-Activity Limitations Locomotion Level;Stairs;Transfers;Stand   ?  Examination-Participation Restrictions Management consultant;Shop   ?  Rehab Potential Fair   ?  PT Frequency 2x / week   ?  PT [place]

## 2022-03-28 DIAGNOSIS — J449 Chronic obstructive pulmonary disease, unspecified: Secondary | ICD-10-CM | POA: Diagnosis not present

## 2022-03-28 DIAGNOSIS — E119 Type 2 diabetes mellitus without complications: Secondary | ICD-10-CM | POA: Diagnosis not present

## 2022-03-28 DIAGNOSIS — I739 Peripheral vascular disease, unspecified: Secondary | ICD-10-CM | POA: Diagnosis not present

## 2022-03-28 DIAGNOSIS — R351 Nocturia: Secondary | ICD-10-CM | POA: Diagnosis not present

## 2022-03-28 DIAGNOSIS — Z8679 Personal history of other diseases of the circulatory system: Secondary | ICD-10-CM | POA: Diagnosis not present

## 2022-03-28 DIAGNOSIS — N400 Enlarged prostate without lower urinary tract symptoms: Secondary | ICD-10-CM | POA: Diagnosis not present

## 2022-03-28 DIAGNOSIS — R270 Ataxia, unspecified: Secondary | ICD-10-CM | POA: Diagnosis not present

## 2022-03-28 DIAGNOSIS — I10 Essential (primary) hypertension: Secondary | ICD-10-CM | POA: Diagnosis not present

## 2022-03-29 ENCOUNTER — Ambulatory Visit: Payer: Medicare HMO | Admitting: Pulmonary Disease

## 2022-03-29 ENCOUNTER — Encounter: Payer: Self-pay | Admitting: Pulmonary Disease

## 2022-03-29 VITALS — BP 138/78 | HR 81 | Temp 98.5°F | Ht 71.0 in | Wt 185.0 lb

## 2022-03-29 DIAGNOSIS — J438 Other emphysema: Secondary | ICD-10-CM

## 2022-03-29 NOTE — Progress Notes (Signed)
? ?      ?Ryan Mccarty    [id number]    [birth year] ? ?Primary Care Physician:Bonnita Hollow, MD ? ?Referring Physician: Marmet Nation, MD ?[address] ?[place],  [place] [address] ? ?Chief complaint:   ? ?Patient with a history of emphysema, shortness of breath ? ?HPI: ? ?Was recently seen by oncology, a PET scan has been ordered to assess lung nodules and activity ? ?Shortness of breath on exertion ? ?At some point did have a stroke, exercise limitation, shortness of breath with minimal exertion ?Currently participating in rehab ? ?An active smoker about a pack a day, did try to quit in the past, at some point was on Chantix and relates that he may have passed out related to Chantix ? ?We did discuss the CT scan findings of extensive emphysema, risk with continuing to smoke, risk of neoplastic process with his smoking ? ?He has used Symbicort in the past and does have a couple of refills with him, is not aware whether the refills have not expired, he feels the inhalers are expensive, does not use albuterol on a regular basis, tries to use it in the evening when he feels short of breath ? ?Recently abnormal CT with multiple lung nodules and some abnormalities also noted on his kidney and adrenals-PET scan has been ordered and pending ? ?Outpatient Encounter Medications as of 03/29/2022  ?Medication Sig  ? albuterol (VENTOLIN HFA) 108 (90 Base) MCG/ACT inhaler SMARTSIG:2 Puff(s) Via Inhaler 4 Times Daily PRN  ? aspirin 81 MG chewable tablet Chew 1 tablet (81 mg total) by mouth daily.  ? atorvastatin (LIPITOR) 20 MG tablet Take by mouth.  ? glipiZIDE (GLUCOTROL) 5 MG tablet Take 5 mg by mouth daily.  ? metoprolol succinate (TOPROL-XL) 25 MG 24 hr tablet Take by mouth.  ? midodrine (PROAMATINE) 5 MG tablet midodrine 5 mg tablet  ? pyridostigmine (MESTINON) 60 MG tablet pyridostigmine bromide 60 mg tablet  ? [DISCONTINUED] budesonide-formoterol (SYMBICORT) 160-4.5 MCG/ACT inhaler Inhale into the lungs.  (Patient not taking: Reported on 03/29/2022)  ? ?No facility-administered encounter medications on file as of 03/29/2022.  ? ? ?Allergies as of 03/29/2022  ? (No Known Allergies)  ? ? ?Past Medical History:  ?Diagnosis Date  ? Diabetes mellitus without complication ([place])   ? ? ?Past Surgical History:  ?Procedure Laterality Date  ? CYSTECTOMY    ? TONSILLECTOMY    ? ? ?Family History  ?Problem Relation Age of Onset  ? Cancer Father   ? ? ?Social History  ? ?Socioeconomic History  ? Marital status: Married  ?  Spouse name: Not on file  ? Number of children: Not on file  ? Years of education: Not on file  ? Highest education level: Not on file  ?Occupational History  ? Not on file  ?Tobacco Use  ? Smoking status: Every Day  ?  Packs/day: 0.50  ?  Types: Cigarettes  ?  Passive exposure: Never  ? Smokeless tobacco: Never  ?Vaping Use  ? Vaping Use: Never used  ?Substance and Sexual Activity  ? Alcohol use: Never  ? Drug use: Never  ? Sexual activity: Not on file  ?Other Topics Concern  ? Not on file  ?Social History Narrative  ? Not on file  ? ?Social Determinants of Health  ? ?Financial Resource Strain: Not on file  ?Food Insecurity: Not on file  ?Transportation Needs: Not on file  ?Physical Activity: Not on file  ?Stress: Not  on file  ?Social Connections: Not on file  ?Intimate Partner Violence: Not on file  ? ? ?Review of Systems  ?Constitutional:  Negative for fatigue.  ?Respiratory:  Positive for shortness of breath.   ? ?Vitals:  ? 03/29/22 1434  ?BP: 138/78  ?Pulse: 81  ?Temp: 98.5 ?F (36.9 ?C)  ?SpO2: 98%  ? ? ? ?Physical Exam ?Constitutional:   ?   Appearance: Normal appearance.  ?HENT:  ?   Head: Normocephalic.  ?   Mouth/Throat:  ?   Mouth: Mucous membranes are moist.  ?Cardiovascular:  ?   Rate and Rhythm: Normal rate and regular rhythm.  ?   Heart sounds: No murmur heard. ?  No friction rub.  ?Pulmonary:  ?   Effort: No respiratory distress.  ?   Breath sounds: No stridor. No wheezing or rhonchi.   ?Musculoskeletal:  ?   Cervical back: No rigidity or tenderness.  ?Neurological:  ?   Mental Status: He is alert.  ?Psychiatric:     ?   Mood and Affect: Mood normal.  ? ? ? ?Data Reviewed: ?Most recent CT scan of the chest reviewed with the patient-no previous CT to compare current 1 with-PET scan does reveal extensive emphysema and multiple lung nodules there is also ?Left renal cystic lesion, right adrenal nodule ? ?Assessment:  ?Multiple lung nodules ?-PET scan already ordered to assess for activity ?-May need invasive intervention-navigational bronchoscopy for biopsy, IR directed biopsy may also be considered ? ?Chronic obstructive pulmonary disease/emphysema ?-Does require inhaler to maintain stability ?-Preference would be an anticholinergic with a LABA or triple therapy ?-Finds the medications expensive, does have Symbicort at home ?-Encouraged to start using Symbicort on a regular basis ?-Rescue inhaler use as needed ? ?The importance and impact of continuing to smoke discussed ? ?He did get defensive about the smoking and stated that he is planning on quitting ? ?Deconditioning start muscle weakness ?-He will continue with pulmonary rehab ? ?He does have an appointment following up with the oncologist ?-Further intervention for the nodules will include biopsy if clinically significant activity in the nodules-navigational bronchoscopy versus IR directed bronchoscopy ? ?Plan/Recommendations: ?Continue current inhalers ? ?Encouraged to give [place] a call if his Symbicort does not seem to be helping and we can try a different inhaler prescription ? ?Smoking cessation counseling ? ?I will see him back in about 6 to 8 weeks ? ?Encouraged to call with any significant concerns ? ? ?Sherrilyn Rist MD ?[facility] ?03/29/2022, 3:35 PM ? ?CC: [place] Nation, MD ? ? ? ?

## 2022-03-29 NOTE — Patient Instructions (Addendum)
Schedule you for breathing study ? ?Start using your Symbicort 2 puffs twice a day regardless of how you are feeling ? ?Use your rescue inhaler albuterol as needed up to 4 times a day ? ?Since the PET scan is being done in a different system, currently call us to let us know so that we can check up on it ?-A biopsy may be needed depending on PET scan findings ? ? ?I will see you in about 6 weeks ? ?If the Symbicort is out of date, call us to put in a different prescription ? ?I will normally put you on a different inhaler but since you have Symbicort still left over-lets try and use it ? ?Call with significant concerns ? ?You need to stay off cigarettes ?

## 2022-04-03 DIAGNOSIS — I739 Peripheral vascular disease, unspecified: Secondary | ICD-10-CM | POA: Diagnosis not present

## 2022-04-03 DIAGNOSIS — R270 Ataxia, unspecified: Secondary | ICD-10-CM | POA: Diagnosis not present

## 2022-04-03 DIAGNOSIS — Z720 Tobacco use: Secondary | ICD-10-CM | POA: Diagnosis not present

## 2022-04-03 DIAGNOSIS — N400 Enlarged prostate without lower urinary tract symptoms: Secondary | ICD-10-CM | POA: Diagnosis not present

## 2022-04-03 DIAGNOSIS — R351 Nocturia: Secondary | ICD-10-CM | POA: Diagnosis not present

## 2022-04-03 DIAGNOSIS — Z8679 Personal history of other diseases of the circulatory system: Secondary | ICD-10-CM | POA: Diagnosis not present

## 2022-04-03 DIAGNOSIS — J449 Chronic obstructive pulmonary disease, unspecified: Secondary | ICD-10-CM | POA: Diagnosis not present

## 2022-04-03 DIAGNOSIS — E119 Type 2 diabetes mellitus without complications: Secondary | ICD-10-CM | POA: Diagnosis not present

## 2022-04-05 ENCOUNTER — Other Ambulatory Visit (HOSPITAL_COMMUNITY): Payer: Self-pay | Admitting: Hematology and Oncology

## 2022-04-05 DIAGNOSIS — R918 Other nonspecific abnormal finding of lung field: Secondary | ICD-10-CM

## 2022-04-05 DIAGNOSIS — D72829 Elevated white blood cell count, unspecified: Secondary | ICD-10-CM

## 2022-04-06 ENCOUNTER — Ambulatory Visit (HOSPITAL_COMMUNITY)
Admission: RE | Admit: 2022-04-06 | Discharge: 2022-04-06 | Disposition: A | Discharge status: Home / Self Care | Payer: Medicare HMO | Source: Ambulatory Visit | Attending: Hematology and Oncology | Admitting: Hematology and Oncology

## 2022-04-06 DIAGNOSIS — J432 Centrilobular emphysema: Secondary | ICD-10-CM | POA: Diagnosis not present

## 2022-04-06 DIAGNOSIS — R918 Other nonspecific abnormal finding of lung field: Secondary | ICD-10-CM | POA: Diagnosis not present

## 2022-04-06 DIAGNOSIS — D3500 Benign neoplasm of unspecified adrenal gland: Secondary | ICD-10-CM | POA: Diagnosis not present

## 2022-04-06 DIAGNOSIS — I2510 Atherosclerotic heart disease of native coronary artery without angina pectoris: Secondary | ICD-10-CM | POA: Diagnosis not present

## 2022-04-06 DIAGNOSIS — D72829 Elevated white blood cell count, unspecified: Secondary | ICD-10-CM | POA: Diagnosis not present

## 2022-04-06 DIAGNOSIS — R911 Solitary pulmonary nodule: Secondary | ICD-10-CM | POA: Diagnosis not present

## 2022-04-06 DIAGNOSIS — K8020 Calculus of gallbladder without cholecystitis without obstruction: Secondary | ICD-10-CM | POA: Diagnosis not present

## 2022-04-06 IMAGING — PT NM PET TUM IMG INITIAL (PI) SKULL BASE T - THIGH
7 series · 25 of 25 positions shown · non-contrast
Comparison: [DATE] chest and abdomen CT from [HOSPITAL] [REDACTED]. A
chest CT from [DATE] from [HOSPITAL] is also reviewed.

CLINICAL DATA: Initial treatment strategy for pulmonary nodule on
prior chest CT.

EXAM:
NUCLEAR MEDICINE PET SKULL BASE TO THIGH
TECHNIQUE: 9.2 mCi F-18 FDG was injected intravenously. Full-ring PET imaging
was performed from the skull base to thigh after the radiotracer. CT
data was obtained and used for attenuation correction and anatomic
localization.
Fasting blood glucose: 110 mg/dl

[Series 3: ctac · axial · 3.0mm · 0.98mm/px · z∈[-976,-10]mm · 5 of 323 slices shown]
[im 1/323]
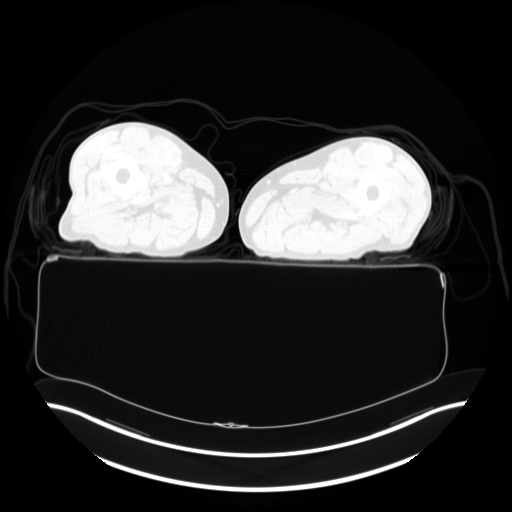
[im 81/323]
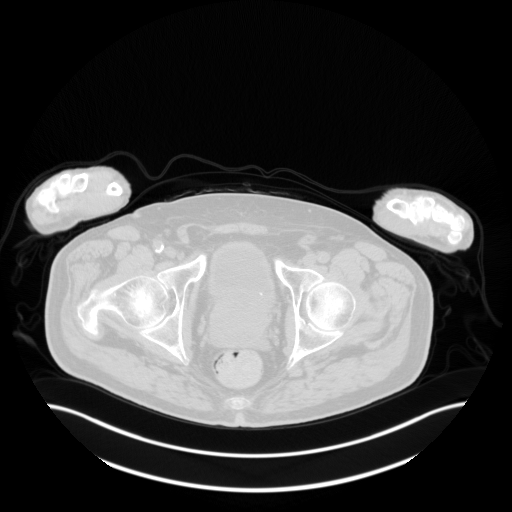
[im 162/323]
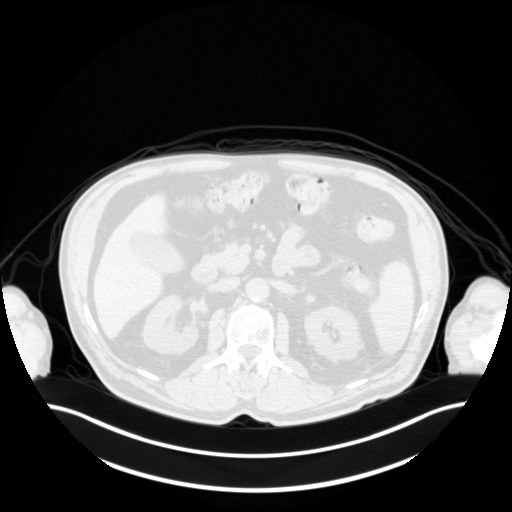
[im 242/323]
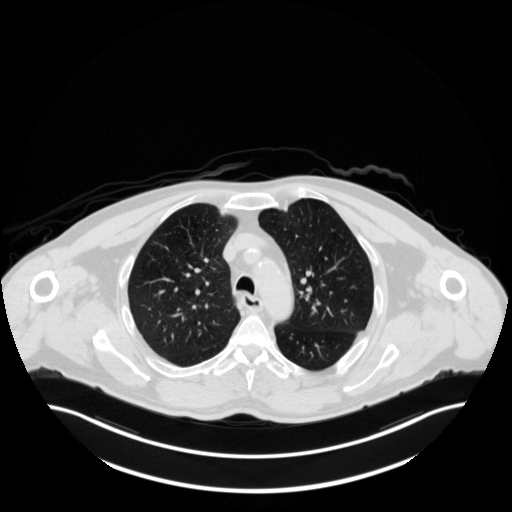
[im 323/323  brain]
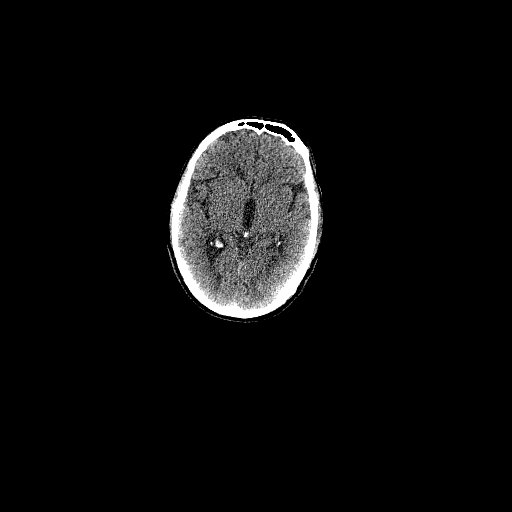

[Series 4: pet ac · axial · 3.0mm · 4.11mm/px · z∈[-976,-10]mm · 5 of 323 slices shown]
[im 1/323]
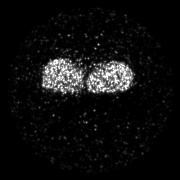
[im 81/323]
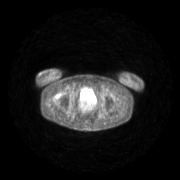
[im 162/323]
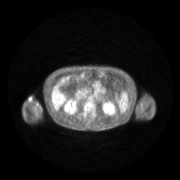
[im 242/323]
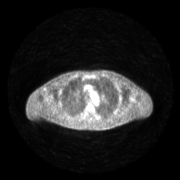
[im 323/323]
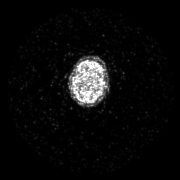

[Series 5: pet nac · axial · 3.0mm · 4.11mm/px · z∈[-976,-10]mm · 5 of 323 slices shown]
[im 1/323]
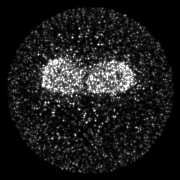
[im 81/323]
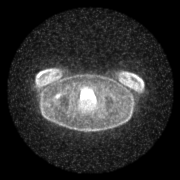
[im 162/323]
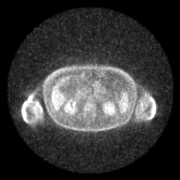
[im 242/323]
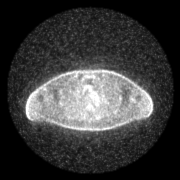
[im 323/323]
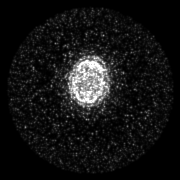

[Series 7: ctlung · axial · 3.0mm · 0.98mm/px · 1 of 99 slices shown]
[im 1/99]
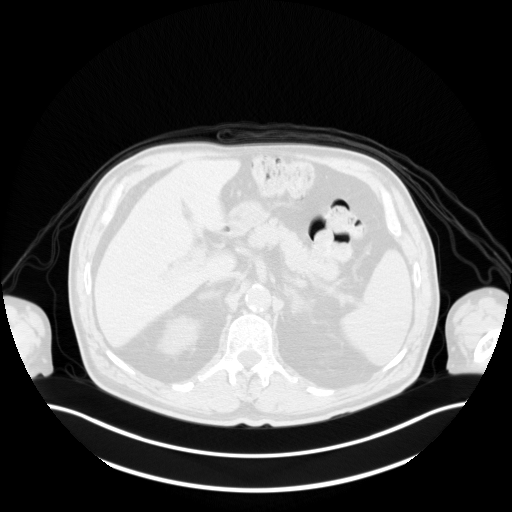

[Series 606: fused tra · 7 of 483 slices shown]
[im 1/483]
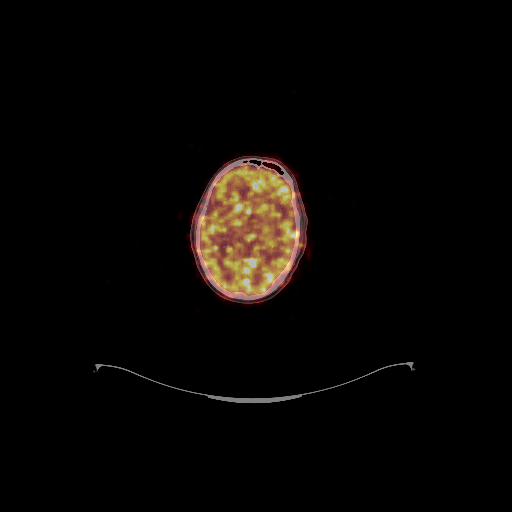
[im 81/483]
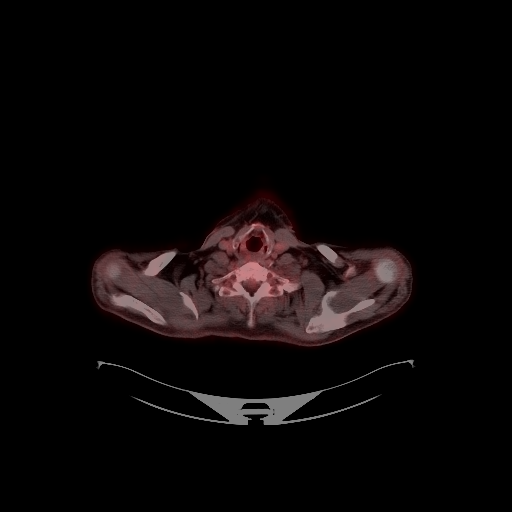
[im 161/483]
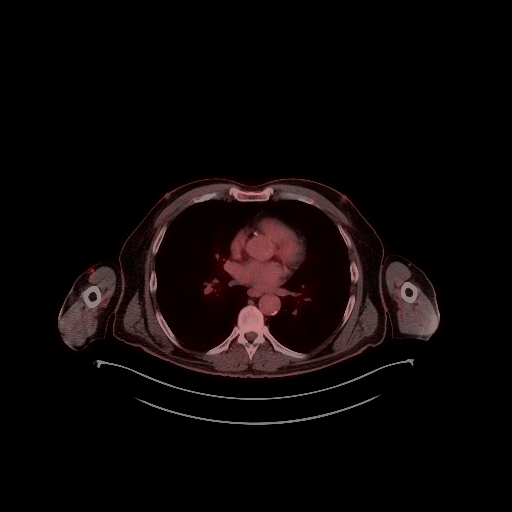
[im 242/483]
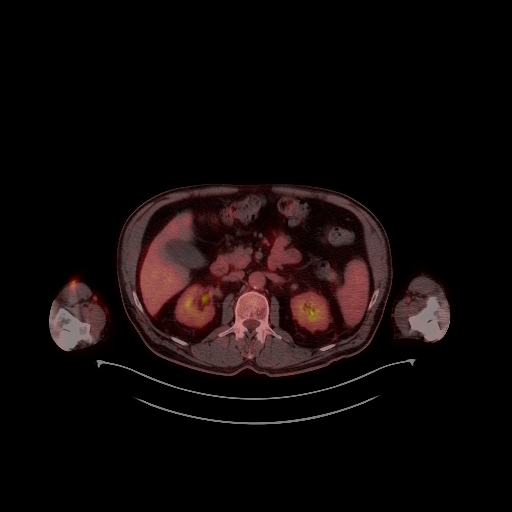
[im 322/483]
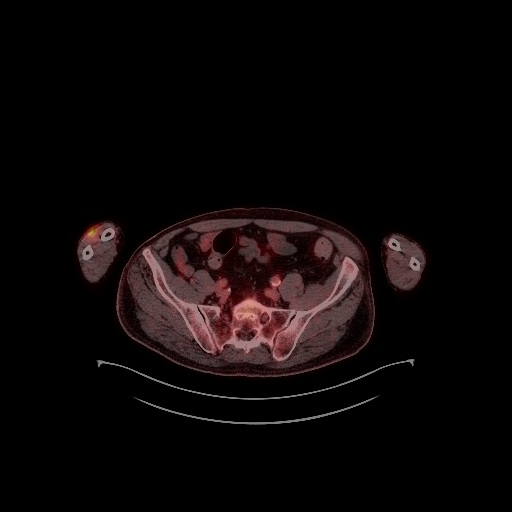
[im 402/483]
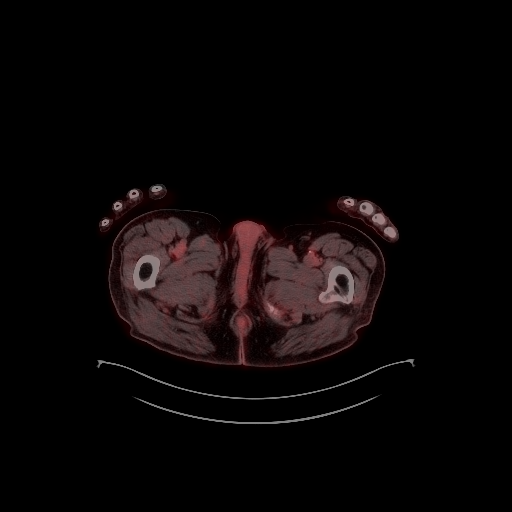
[im 483/483]
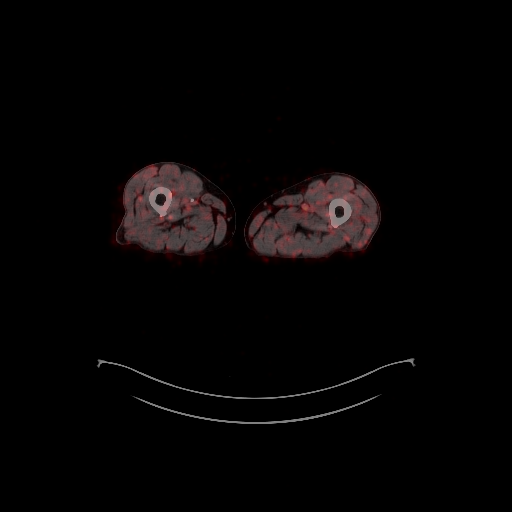

[Series 608: fused cor · 1 of 90 slices shown]
[im 1/90]
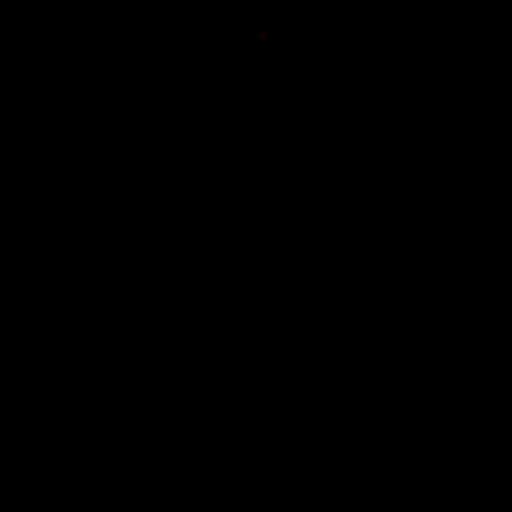

[Series 609: mip cine · coronal · 2.00mm/px · 1 of 48 slices shown]
[im 1/48]
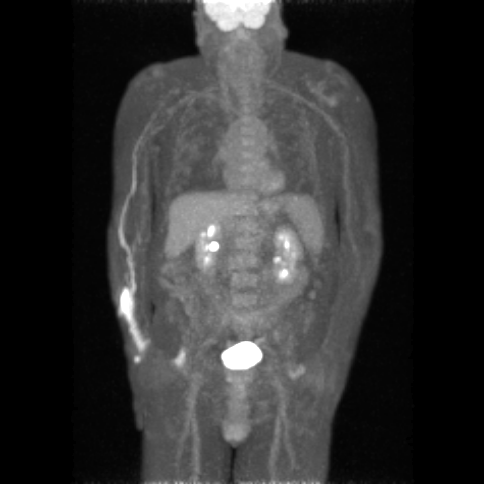

[25 of 25 positions shown; findings below may reference images not displayed]

FINDINGS: Mediastinal blood pool activity: SUV max

Liver activity: SUV max NA

NECK: No areas of abnormal hypermetabolism.

Incidental CT findings: No cervical adenopathy. Bilateral carotid
atherosclerosis.

CHEST: No thoracic nodal hypermetabolism. No lingular
hypermetabolism to correspond to the area of probable scarring.

The superior segment right lower lobe pulmonary nodule measures
cm and a S.U.V. max of 0.9 on 87/3.

Incidental CT findings: Deferred to recent diagnostic CT. Aortic and
coronary artery calcification. Centrilobular emphysema. Right lower
lobe calcified granuloma.

ABDOMEN/PELVIS: No abdominopelvic parenchymal or nodal
hypermetabolism.

Incidental CT findings: Right greater than left adrenal nodularity,
including a low-density 1.5 cm right adrenal nodule, consistent with
an adenoma. No f/up imaging recommended. Low-density renal lesions
have been evaluated previously on dedicated imaging. Cholelithiasis.
Abdominal aortic atherosclerosis. Fat containing ventral abdominal
wall hernia. Moderate prostatomegaly. Urachal remnant.
Diverticulosis.

SKELETON: Hypermetabolism about both anterior femoral necks is
likely degenerative or could represent tendinous strain, greater
right than left. No suspicious marrow hypermetabolism. No
hypermetabolism to correspond to the probable sebaceous cyst within
the anterior left chest wall

Incidental CT findings: none
IMPRESSION: 1. The superior segment right lower lobe pulmonary nodule is at the
low end of PET resolution, not significantly hypermetabolic. As on
the [DATE] CT chest, chest CT follow-up at 6-12 months is
recommended. This recommendation follows the consensus statement:
Guidelines for Management of Incidental Pulmonary Nodules Detected
on CT Images:From the [HOSPITAL] [X6]; published online
before print (10.1148/radiol.[PHONE_NUMBER]).
2. The lingular density is favored to represent scarring and is not
significantly hypermetabolic. Recommend attention on follow-up.
3. Incidental findings, including: Right adrenal adenoma. Aortic
atherosclerosis ([X6]-[X6]), coronary artery atherosclerosis and
emphysema ([X6]-[X6]). Cholelithiasis. Prostatomegaly.

## 2022-04-06 MED ORDER — FLUDEOXYGLUCOSE F - 18 (FDG) INJECTION
9.2000 | Freq: Once | INTRAVENOUS | Status: AC | PRN
  Administered 2022-04-06: 9.2 via INTRAVENOUS

## 2022-04-13 ENCOUNTER — Ambulatory Visit: Payer: Medicare HMO | Admitting: Neurology

## 2022-04-13 ENCOUNTER — Encounter: Payer: Self-pay | Admitting: Neurology

## 2022-04-13 VITALS — BP 155/87 | HR 80 | Ht 71.0 in | Wt 183.0 lb

## 2022-04-13 DIAGNOSIS — R269 Unspecified abnormalities of gait and mobility: Secondary | ICD-10-CM | POA: Diagnosis not present

## 2022-04-13 DIAGNOSIS — D72829 Elevated white blood cell count, unspecified: Secondary | ICD-10-CM | POA: Diagnosis not present

## 2022-04-13 DIAGNOSIS — R911 Solitary pulmonary nodule: Secondary | ICD-10-CM | POA: Diagnosis not present

## 2022-04-13 DIAGNOSIS — D75839 Thrombocytosis, unspecified: Secondary | ICD-10-CM | POA: Diagnosis not present

## 2022-04-13 DIAGNOSIS — W19XXXA Unspecified fall, initial encounter: Secondary | ICD-10-CM | POA: Insufficient documentation

## 2022-04-13 DIAGNOSIS — W19XXXS Unspecified fall, sequela: Secondary | ICD-10-CM | POA: Diagnosis not present

## 2022-04-13 DIAGNOSIS — N2889 Other specified disorders of kidney and ureter: Secondary | ICD-10-CM | POA: Diagnosis not present

## 2022-04-13 DIAGNOSIS — Z113 Encounter for screening for infections with a predominantly sexual mode of transmission: Secondary | ICD-10-CM | POA: Diagnosis not present

## 2022-04-13 DIAGNOSIS — D471 Chronic myeloproliferative disease: Secondary | ICD-10-CM | POA: Diagnosis not present

## 2022-04-13 DIAGNOSIS — R32 Unspecified urinary incontinence: Secondary | ICD-10-CM | POA: Diagnosis not present

## 2022-04-13 NOTE — Progress Notes (Signed)
? ?Chief Complaint  ?Patient presents with  ? New Patient (Initial Visit)  ?  Rm 14. Alone. ?NP/Paper Proficient/[facility]/Gordon Jimmye Norman MD/second opinion ataxia.  ? ? ? ? ?ASSESSMENT AND PLAN ? ?Ryan Mccarty is a 71 y.o. male   ?Status post same level fall in January 2022 with left lower rib fracture ? ?Since then he suffered gait abnormality, urinary incontinence, constipation ? Mild lower extremity spasticity, normal upper and lower extremity strength, hyperreflexia, bilateral Babinski signs ? MRI of thoracic spine for possible thoracic myelopathy ? 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 Long history of smoking, also vascular risk factors of diabetes, aging ? Echocardiogram showed no significant abnormality ? CT angiogram of the neck showed no significant large vessel disease ? Keep aspirin 81 mg daily, ? Increase water intake ? Laboratory today including fasting lipid profile, A1c, ? ? ?DIAGNOSTIC DATA (LABS, IMAGING, TESTING) ?- I reviewed patient records, labs, notes, testing and imaging myself where available. ? ? ?MEDICAL HISTORY: ? ?Ryan Mccarty, is a 71 year old male, seen in request by his primary care physician Dr. [place] Nation, for evaluation of gait abnormality ? ?I reviewed and summarized the referring note. PMHX. ?DM, under good control. ? ?Patient used to live in [place], required in 2021, moved back to [place], on the day of mobility in January 2022, he stepped out of the vehicle when he reached the destination, slipped on the ice, fell backwards, with his head landed on the concrete floor few times, he suffered left lower rib fractures ? ?He had trouble walking since then, he has bilateral feet numbness tingling, worsening constipation, urinary incontinence, have to wear depends, ? ?He was seen by neurologist Dr. Merlene Laughter, ordered MRIs. ? ?I personally reviewed MRI of the brain in December 2022, prominent periventricular small vessel disease,  questionable subacute infarction 7 mm at the posterior right frontal lobe, would not explain his new onset gait abnormality ?CT angiogram of head and neck showed no significant large vessel disease, ? ?MRI of cervical spine showed multilevel degenerative changes, variable degree of foraminal narrowing, most obvious at C3-4, but there is no evidence of canal stenosis, no evidence of spinal cord signal abnormality ? ?Laboratory evaluations in November 2022 showed normal TSH, B12, CMP showed elevated creatinine 1.48, CBC showed mild elevated WBC, hemoglobin of 16.8, ?  ?He denies upper extremity numbness or weakness, denies significant neck, or low back pain. ? ?PHYSICAL EXAM: ?  ?Vitals:  ? 04/13/22 0950  ?BP: (!) 155/87  ?Pulse: 80  ?Weight: 183 lb (83 kg)  ?Height: '5\' 11"'$  (1.803 m)  ? ?Not recorded ?  ? ? ?Body mass index is 25.52 kg/m?. ? ?PHYSICAL EXAMNIATION: ? ?Gen: NAD, conversant, well nourised, well groomed                     ?Cardiovascular: Regular rate rhythm, no peripheral edema, warm, nontender. ?Eyes: Conjunctivae clear without exudates or hemorrhage ?Neck: Supple, no carotid bruits. ?Pulmonary: Clear to auscultation bilaterally  ? ?NEUROLOGICAL EXAM: ? ?MENTAL STATUS: Sad looking elderly male ?Speech/cognition: ?Awake, alert, oriented to history taking and casual conversation ?  ?CRANIAL NERVES: ?CN II: Visual fields are full to confrontation. Pupils are round equal and briskly reactive to light. ?CN III, IV, VI: extraocular movement are normal. No ptosis. ?CN V: Facial sensation is intact to light touch ?CN VII: Face is symmetric with normal eye closure  ?CN VIII: Hearing is normal to causal conversation. ?CN IX, X:  Phonation is normal. ?CN XI: Head turning and shoulder shrug are intact ? ?MOTOR: Bilateral upper extremity motor strength is normal, mild lower extremity spasticity, no significant lower extremity proximal and distal muscle weakness ? ?REFLEXES: ?Reflexes are 2+ and symmetric at the  biceps, triceps, 3/3 knees, and ankles. Plantar responses are extensor bilaterally ? ?SENSORY: ?Intact to light touch, pinprick and decreased vibratory sensation to mid shin level ? ?COORDINATION: ?There is no trunk or limb dysmetria noted. ? ?GAIT/STANCE: He can get up from seated position arm crossed, stiff, wide-based, cautious unsteady gait ? ?REVIEW OF SYSTEMS:  ?Full 14 system review of systems performed and notable only for as above ?All other review of systems were negative. ? ? ?ALLERGIES: ?No Known Allergies ? ?HOME MEDICATIONS: ?Current Outpatient Medications  ?Medication Sig Dispense Refill  ? albuterol (VENTOLIN HFA) 108 (90 Base) MCG/ACT inhaler SMARTSIG:2 Puff(s) Via Inhaler 4 Times Daily PRN    ? aspirin 81 MG chewable tablet Chew 1 tablet (81 mg total) by mouth daily. 30 tablet 0  ? Budesonide-Formoterol Fumarate (SYMBICORT IN) Inhale into the lungs.    ? glipiZIDE (GLUCOTROL) 5 MG tablet Take 5 mg by mouth daily.    ? ?No current facility-administered medications for this visit.  ? ? ?PAST MEDICAL HISTORY: ?Past Medical History:  ?Diagnosis Date  ? Diabetes mellitus without complication ([place])   ? ? ?PAST SURGICAL HISTORY: ?Past Surgical History:  ?Procedure Laterality Date  ? CYSTECTOMY    ? TONSILLECTOMY    ? ? ?FAMILY HISTORY: ?Family History  ?Problem Relation Age of Onset  ? Cancer Father   ? ? ?SOCIAL HISTORY: ?Social History  ? ?Socioeconomic History  ? Marital status: Married  ?  Spouse name: Not on file  ? Number of children: Not on file  ? Years of education: Not on file  ? Highest education level: Not on file  ?Occupational History  ? Not on file  ?Tobacco Use  ? Smoking status: Every Day  ?  Packs/day: 0.50  ?  Types: Cigarettes  ?  Passive exposure: Never  ? Smokeless tobacco: Never  ?Vaping Use  ? Vaping Use: Never used  ?Substance and Sexual Activity  ? Alcohol use: Never  ? Drug use: Never  ? Sexual activity: Not on file  ?Other Topics Concern  ? Not on file  ?Social History  Narrative  ? Not on file  ? ?Social Determinants of Health  ? ?Financial Resource Strain: Not on file  ?Food Insecurity: Not on file  ?Transportation Needs: Not on file  ?Physical Activity: Not on file  ?Stress: Not on file  ?Social Connections: Not on file  ?Intimate Partner Violence: Not on file  ? ? ? ? ?Marcial Pacas, M.D. Ph.D. ? ?[facility] ?[address], [address] ?[place], [place] [address] ?Ph: [phone]) [phone] ?Fax: [phone] ? ?CC:  [place] Nation, MD ?[address] ?[place],  [place] [address]  [place] Nation, MD   ?

## 2022-04-15 LAB — LIPID PANEL
Chol/HDL Ratio: 5.1 ratio — ABNORMAL HIGH (ref 0.0–5.0)
Cholesterol, Total: 142 mg/dL (ref 100–199)
HDL: 28 mg/dL — ABNORMAL LOW (ref >39)
LDL Chol Calc (NIH): 88 mg/dL (ref 0–99)
Triglycerides: 149 mg/dL (ref 0–149)
VLDL Cholesterol Cal: 26 mg/dL (ref 5–40)

## 2022-04-15 LAB — HIV ANTIBODY (ROUTINE TESTING W REFLEX): HIV Screen 4th Generation wRfx: NONREACTIVE

## 2022-04-15 LAB — HGB A1C W/O EAG: Hgb A1c MFr Bld: 5.6 % (ref 4.8–5.6)

## 2022-04-15 LAB — C-REACTIVE PROTEIN: CRP: 2 mg/L (ref 0–10)

## 2022-04-15 LAB — ANA W/REFLEX IF POSITIVE: Anti Nuclear Antibody (ANA): NEGATIVE

## 2022-04-15 LAB — RPR: RPR Ser Ql: NONREACTIVE

## 2022-04-15 LAB — VITAMIN B12: Vitamin B-12: 244 pg/mL (ref 232–1245)

## 2022-04-15 LAB — COPPER, SERUM: Copper: 105 ug/dL (ref 69–132)

## 2022-04-15 LAB — SEDIMENTATION RATE: Sed Rate: 2 mm/hr (ref 0–30)

## 2022-04-18 ENCOUNTER — Telehealth: Payer: Self-pay | Admitting: Neurology

## 2022-04-18 NOTE — Telephone Encounter (Signed)
Please call patient for low normal range B12 level 244, he should take over-the-counter B12 supplement 1000 mcg daily ? ?Rest of the laboratory evaluation showed no significant abnormalities. ?

## 2022-04-18 NOTE — Telephone Encounter (Signed)
[employer] Josem Kaufmann: [id number] (exp. 04/18/22-05/18/22) sent to GI they will reach out to the patient to schedule. ?

## 2022-04-18 NOTE — Telephone Encounter (Signed)
Spoke with patient and relayed results. He verbalized understanding of results and expressed appreciation for the call. All questions answered. ?

## 2022-04-20 ENCOUNTER — Ambulatory Visit (HOSPITAL_COMMUNITY): Payer: Medicare HMO | Attending: Otolaryngology | Admitting: Physical Therapy

## 2022-04-20 ENCOUNTER — Encounter (HOSPITAL_COMMUNITY): Payer: Self-pay | Admitting: Physical Therapy

## 2022-04-20 ENCOUNTER — Ambulatory Visit
Admission: RE | Admit: 2022-04-20 | Discharge: 2022-04-20 | Disposition: A | Discharge status: Home / Self Care | Payer: Medicare HMO | Source: Ambulatory Visit | Attending: Neurology | Admitting: Neurology

## 2022-04-20 DIAGNOSIS — R262 Difficulty in walking, not elsewhere classified: Secondary | ICD-10-CM | POA: Insufficient documentation

## 2022-04-20 DIAGNOSIS — R296 Repeated falls: Secondary | ICD-10-CM | POA: Diagnosis not present

## 2022-04-20 DIAGNOSIS — R42 Dizziness and giddiness: Secondary | ICD-10-CM | POA: Insufficient documentation

## 2022-04-20 DIAGNOSIS — W19XXXS Unspecified fall, sequela: Secondary | ICD-10-CM

## 2022-04-20 DIAGNOSIS — R269 Unspecified abnormalities of gait and mobility: Secondary | ICD-10-CM | POA: Diagnosis not present

## 2022-04-20 DIAGNOSIS — R32 Unspecified urinary incontinence: Secondary | ICD-10-CM

## 2022-04-20 NOTE — Therapy (Signed)
?OUTPATIENT PHYSICAL THERAPY TREATMENT NOTE ? ? ?Patient Name: LUCILE DIDONATO ?MRN: [id number] ?DOB:[birth year], 71 y.o., male ?Today's Date: 04/20/2022 ? ?PCP: [place] Nation, MD ?Elmon Kirschner, MD  ? ? ?Progress Note ?Reporting Period 02/09/2022  to 03/23/2022 ? ?See note below for Objective Data and Assessment of Progress/Goals.  ? ?  ? PT End of Session - 03/23/22 1008   ? ? Visit Number 10   ? Number of Visits 22  ? Date for PT Re-Evaluation 05/04/2022 ?  ? Authorization Type [employer]   ? Authorization Time Period 12 approved 2/23-03/23/22 ; requested more visits.   ? Authorization - Visit Number 10   ? Authorization - Number of Visits 12   ? Progress Note Due on Visit 10   ? PT Start Time 1010   ? PT Stop Time 1045   ? PT Time Calculation (min) 35 min   ? ?  ?  ? ?  ? ? ?Past Medical History:  ?Diagnosis Date  ? Diabetes mellitus without complication ([place])   ? ?Past Surgical History:  ?Procedure Laterality Date  ? CYSTECTOMY    ? TONSILLECTOMY    ? ?Patient Active Problem List  ? Diagnosis Date Noted  ? Gait abnormality 04/13/2022  ? Fall 04/13/2022  ? Urinary incontinence 04/13/2022  ? 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 Other elevated white blood cell count 01/21/2020  ? Stage 3a chronic kidney disease ([place]) 07/16/2019  ? Type 2 diabetes mellitus ([place]) 07/16/2019  ? Acute renal failure syndrome ([place]) 01/15/2019  ? Arrhythmia 01/15/2019  ? Essential hypertension 01/15/2019  ? Hyperkalemia 01/15/2019  ? Mixed hyperlipidemia 01/15/2019  ? Vitamin D deficiency 01/15/2019  ? ? ?REFERRING DIAG: dizziness  ? ?THERAPY DIAG:  ?Dizziness and giddiness ? ?Difficulty in walking, not elsewhere classified ? ?Repeated falls ? ?PERTINENT HISTORY: Diagnostic imaging (MRI brain): "IMPRESSION: ?Mildly motion degraded exam. ?  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 ?  ?Background  chronic small vessel ischemic changes which are advanced ?in the cerebral white matter, and mild in the pons. ?  ?Prominent perivascular space versus chronic lacunar infarct within ?the left thalamus. ?  ?Mild generalized cerebral and cerebellar atrophy." ? ?PRECAUTIONS:  Fall ?03/23/2022 ?SUBJECTIVE: Pt feels he has regressed since here last, continues to c/o dizziness.  No reoprts of pain or recent fall, does have close call.   ?PAIN:   ?Are you having pain? Yes: NPRS scale: 0/10 ?Pain location: feet  ?Pain description: sore  ?Aggravating factors: WB ?Relieving factors: rest  ? ? ?Dizziness is a 4/10  ?    ?  Observation/Other Assessments  ?  Focus on Therapeutic Outcomes (FOTO)  63% functioning was 54% function   ?     ?  ROM / Strength  ?  AROM / PROM / Strength Strength   ?     ?  Strength  ?  Strength Assessment Site Hip;Knee;Ankle   ?  Right/Left Hip Right;Left   ?  Right Hip Flexion 5/5   ?  Left Hip Flexion 5/5   ?  Right/Left Knee Right;Left   ?  Right Knee Extension 4+ was 4/5   ?  Left Knee Extension 5/5 was 4/5   ?  Right/Left Ankle Right;Left   ?  Right Ankle Dorsiflexion 4+/5  was 4+/5  ?  Left Ankle Dorsiflexion 4+/5  was 4+/5   ?     ?  Ambulation/Gait  ?  Ambulation/Gait Yes   ?  Ambulation/Gait Assistance 4: Min assist   ?  Assistive device None   ?  Gait Pattern Decreased step length - right;Decreased step length - left;Decreased stride length;Decreased dorsiflexion - right;Decreased dorsiflexion - left;Shuffle   ?  Ambulation Surface Level;Indoor   ?     ?  Standardized Balance Assessment  ?  Standardized Balance Assessment Dynamic Gait Index   ?     ?  Dynamic Gait Index  ?  Level Surface Moderate Impairment   ?  Change in Gait Speed minumumImpairment   ?  Gait with Horizontal Head Turns Moderate Impairment   ?  Gait with Vertical Head Turns Moderate Impairment   ?  Gait and Pivot Turn Moderate Impairment   ?  Step Over Obstacle Moderate Impairment   ?  Step Around Obstacles Moderate Impairment    ?  Steps Minimal Impairment   ?  Total Score 10 was 7   ? Gt pt walks flat footed tripping on his own feet when ambulating, of interest as pt increases speed  ?Pt gait improves.  ?   ?  ?Cervical and scapular retraction x 10 each ?AROM:  Cervical ROM:  Rt rotation 55; Lt 70  ?    ?TODAY'S TREATMENT:  ?04/20/22: ?Nustep UE/LE hill 2 L1 x 5 min ? ?           03/20/22 ?Rec bike dynamic warmup for ROM and sequencing 4 min  ?Standing heel/ toe raises x 20 each ?Rocker board PF/DF 3 min  ?Tandem stance on foam 3 x 20"  ?Step over hurdles 4 inch 4 RT  ?Sidestep over hurdles 4 inch 4RT  ?Forward reach (testing anterior bounds of COG) 4 min, in parallell bars reaching toward cones  ? ? ?03/15/22    rockerboard forward and back as well as RT/Lt x 10 ?Standing in good posture tandem stance x with head turns x 10  ?                            Marching x 10  ?                            Side stepping  with green theraband x 2 ?                            Functional squat to heel raise with med ball yellow x 15         (baketballshot)     toe raises on slope x 10  ?                            Toe tap x 10 onto 6" step  ?                             Heel toe gait x 2 reps  ?                             Retro gt x 2 rep  ?  Stepping over 4" hurdles  forward 3 RT   ?                             Sitting:  sit to stand x 10  ?           03/13/22 ?          Standing:  Good posture at wall hold x 2' ?                            Staying in good posture head turns x 5 ?                            Standing in good posture tandem stance x 3 B  ?                            Marching x 10  ?                            Side stepping x 3  ?                            Functional squat to heel raise with med ball yellow x 10         (baketballshot) ?                            Toe tap x 10 onto 6" step  ?                             Heel toe gait x 2 reps  ?                             Retro gt x 2 rep  ?           Sitting:       Scapular retraction x 10 reps  ?                            Sit to stand x 10  ? ? ? ? ?PATIENT EDUCATION: ?Education details: on exercise for and function  ?Person educated: Patient ?Education method: Explanation and Demonstration ?Education comprehension: verbalized understanding and returned demonstration ? ? ?HOME EXERCISE PROGRAM: ?cervical rotation, sit to stand, sitting posture correction, sitting hip abduction with green tband x 10  ? ? PT Short Term Goals - 02/15/22 1030   ? ?  ? PT SHORT TERM GOAL #1  ? Title Patient will be independent with initial HEP and self-management strategies to improve functional outcomes   ? Time 3   ? Period Weeks   ? Status On-going   ? Target Date 03/02/22   ?  ? PT SHORT TERM GOAL #2  ? Title Patient will improve DGI by at least 5 points to demo improved balance and reduced risk for falls.   ? Time 3   ? Period Weeks   ? Status On-going   ? Target Date 03/02/22   ? ?  ?  ? ?  ? ? ?  PT Long Term Goals - 02/15/22 1030   ? ?  ? PT LONG TERM GOAL #1  ? Title Patient will improve DGI by at least 10 points to demo improved balance and reduced risk for falls.   ? Time 6   ? Period Weeks   ? Status On-going   ? Target Date 03/23/22   ?  ? PT LONG TERM GOAL #2  ? Title Patient will have equal to or > 4+/5 MMT throughout BLE to improve ability to perform functional mobility, stair ambulation and ADLs.   ? Time 6   ? Period Weeks   ? Status Met   ? Target Date 03/23/22   ?  ? PT LONG TERM GOAL #3  ? Title Patient will demo correct use of LRAD when ambulation for reduced risk for future falls.   ? Time 6   ? Period Weeks   ? Status N/A  ? Target Date 03/23/22   ? ?  ?  ? ?  ? ? ? ? Plan - 03/01/22 0946   ?  ?  Clinical Impression Statement PT reassessed today.  He has improved in all aspects but continues to have significant gait and balance deficits and continues to fall.Pt walks flat footed tripping on  his own feet when ambulating, of interest as pt increases speed gait improves.   PT vocalized that most of the time he is falling he is bending forward.   ?  ?  Examination-Activity Limitations Locomotion Level;Stairs;Transfers;Stand   ?  Examination-Participation Restrictions Arletha Grippe

## 2022-04-20 NOTE — Therapy (Signed)
?OUTPATIENT PHYSICAL THERAPY TREATMENT NOTE ? ? ?Patient Name: Ryan Mccarty ?MRN: [id number] ?DOB:[birth year], 71 y.o., male ?Today's Date: 04/20/2022 ? ?PCP: [place] Nation, MD ?Elmon Kirschner, MD  ? ? ?  ? PT End of Session - 03/23/22 1008   ? ? Visit Number 11  ? Number of Visits 22  ? Date for PT Re-Evaluation 05/04/2022 ?  ? Authorization Type [employer]   ? Authorization Time Period 12 approved 4/10-23-11; requested more visits.   ? Authorization - Visit Number 1  ? Authorization - Number of Visits 12   ? Progress Note Due on Visit 10   ? PT Start Time 1010   ? PT Stop Time 1045   ? PT Time Calculation (min) 40 min   ? ?  ?  ? ?  ? ? ?Past Medical History:  ?Diagnosis Date  ? Diabetes mellitus without complication ([place])   ? ?Past Surgical History:  ?Procedure Laterality Date  ? CYSTECTOMY    ? TONSILLECTOMY    ? ?Patient Active Problem List  ? Diagnosis Date Noted  ? Gait abnormality 04/13/2022  ? Fall 04/13/2022  ? Urinary incontinence 04/13/2022  ? 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 Other elevated white blood cell count 01/21/2020  ? Stage 3a chronic kidney disease ([place]) 07/16/2019  ? Type 2 diabetes mellitus ([place]) 07/16/2019  ? Acute renal failure syndrome ([place]) 01/15/2019  ? Arrhythmia 01/15/2019  ? Essential hypertension 01/15/2019  ? Hyperkalemia 01/15/2019  ? Mixed hyperlipidemia 01/15/2019  ? Vitamin D deficiency 01/15/2019  ? ? ?REFERRING DIAG: dizziness  ? ?THERAPY DIAG:  ?Dizziness and giddiness ? ?Difficulty in walking, not elsewhere classified ? ?Repeated falls ? ?PERTINENT HISTORY: Diagnostic imaging (MRI brain): "IMPRESSION: ?Mildly motion degraded exam. ?  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 ?  ?Background chronic small vessel ischemic changes which are advanced ?in the cerebral white matter, and mild in the pons. ?  ?Prominent perivascular  space versus chronic lacunar infarct within ?the left thalamus. ?  ?Mild generalized cerebral and cerebellar atrophy." ? ?PRECAUTIONS:  Fall ?03/23/2022 ?SUBJECTIVE:  Pt states he did not know he was approved. The only reason that he knew to come in was he called the department.  The patient was cleared of cancer.  His legs just give out on him.  He has  ?An MRI tomorrow on his back to clear that.   ?PAIN:  Are you having pain? No  ?TODAY'S TREATMENT:  ?04/20/2022: ?Nustep:  hills 3 for 5 minutes ?Sit:         sit to stand x 15 ?              Leg press 3 PL x 10  ?              Knee extension 1 PL x 5 x 2 reps ?Stand:   functional squat with yellow ball pushing out then come to heel raise with B UE flexion with ball x 15 ?              Toe raises x 15 ?              Tandem stance with head turns B x 3 reps  ?              Single leg stance B x 3 rep  ?  Tandem gt x 2 Reps  ? ? ?4/6 reassessment  ?    ?  Observation/Other Assessments  ?  Focus on Therapeutic Outcomes (FOTO)  63% functioning was 54% function   ?     ?  ROM / Strength  ?  AROM / PROM / Strength Strength   ?     ?  Strength  ?  Strength Assessment Site Hip;Knee;Ankle   ?  Right/Left Hip Right;Left   ?  Right Hip Flexion 5/5   ?  Left Hip Flexion 5/5   ?  Right/Left Knee Right;Left   ?  Right Knee Extension 4+ was 4/5   ?  Left Knee Extension 5/5 was 4/5   ?  Right/Left Ankle Right;Left   ?  Right Ankle Dorsiflexion 4+/5  was 4+/5  ?  Left Ankle Dorsiflexion 4+/5  was 4+/5   ?     ?  Ambulation/Gait  ?  Ambulation/Gait Yes   ?  Ambulation/Gait Assistance 4: Min assist   ?  Assistive device None   ?  Gait Pattern Decreased step length - right;Decreased step length - left;Decreased stride length;Decreased dorsiflexion - right;Decreased dorsiflexion - left;Shuffle   ?  Ambulation Surface Level;Indoor   ?     ?  Standardized Balance Assessment  ?  Standardized Balance Assessment Dynamic Gait Index   ?     ?  Dynamic Gait Index  ?  Level Surface  Moderate Impairment   ?  Change in Gait Speed minumumImpairment   ?  Gait with Horizontal Head Turns Moderate Impairment   ?  Gait with Vertical Head Turns Moderate Impairment   ?  Gait and Pivot Turn Moderate Impairment   ?  Step Over Obstacle Moderate Impairment   ?  Step Around Obstacles Moderate Impairment   ?  Steps Minimal Impairment   ?  Total Score 10 was 7   ? Gt pt walks flat footed tripping on his own feet when ambulating, of interest as pt increases speed  ?Pt gait improves.  ?   ?  ?Cervical and scapular retraction x 10 each ?AROM:  Cervical ROM:  Rt rotation 55; Lt 70  ?    ? ? ?           03/20/22 ?Rec bike dynamic warmup for ROM and sequencing 4 min  ?Standing heel/ toe raises x 20 each ?Rocker board PF/DF 3 min  ?Tandem stance on foam 3 x 20"  ?Step over hurdles 4 inch 4 RT  ?Sidestep over hurdles 4 inch 4RT  ?Forward reach (testing anterior bounds of COG) 4 min, in parallell bars reaching toward cones  ? ? ?03/15/22    rockerboard forward and back as well as RT/Lt x 10 ?Standing in good posture tandem stance x with head turns x 10  ?                            Marching x 10  ?                            Side stepping  with green theraband x 2 ?                            Functional squat to heel raise with med ball yellow x 15         (  baketballshot)     toe raises on slope x 10  ?                            Toe tap x 10 onto 6" step  ?                             Heel toe gait x 2 reps  ?                             Retro gt x 2 rep  ?                             Stepping over 4" hurdles  forward 3 RT   ?                             Sitting:  sit to stand x 10  ?           03/13/22 ?          Standing:  Good posture at wall hold x 2' ?                            Staying in good posture head turns x 5 ?                            Standing in good posture tandem stance x 3 B  ?                            Marching x 10  ?                            Side stepping x 3  ?                             Functional squat to heel raise with med ball yellow x 10         (baketballshot) ?                            Toe tap x 10 onto 6" step  ?                             Heel toe gait x 2 reps  ?                             Retro gt x 2 rep  ?           Sitting:      Scapular retraction x 10 reps  ?                            Sit to stand x 10  ? ? ? ? ?PATIENT EDUCATION: ?Education details: on exercise for and function  ?Person educated: Patient ?Education method: Explanation and Demonstration ?  Education comprehension: verbalized understanding and returned demonstration ? ? ?HOME EXERCISE PROGRAM: ?cervical rotation, sit to stand, sitting posture correction, sitting hip abduction with green tband x 10  ? ? PT Short Term Goals - 02/15/22 1030   ? ?  ? PT SHORT TERM GOAL #1  ? Title Patient will be independent with initial HEP and self-management strategies to improve functional outcomes   ? Time 3   ? Period Weeks   ? Status On-going   ? Target Date 03/02/22   ?  ? PT SHORT TERM GOAL #2  ? Title Patient will improve DGI by at least 5 points to demo improved balance and reduced risk for falls.   ? Time 3   ? Period Weeks   ? Status On-going   ? Target Date 03/02/22   ? ?  ?  ? ?  ? ? ? PT Long Term Goals - 02/15/22 1030   ? ?  ? PT LONG TERM GOAL #1  ? Title Patient will improve DGI by at least 10 points to demo improved balance and reduced risk for falls.   ? Time 6   ? Period Weeks   ? Status On-going   ? Target Date 03/23/22   ?  ? PT LONG TERM GOAL #2  ? Title Patient will have equal to or > 4+/5 MMT throughout BLE to improve ability to perform functional mobility, stair ambulation and ADLs.   ? Time 6   ? Period Weeks   ? Status Met   ? Target Date 03/23/22   ?  ? PT LONG TERM GOAL #3  ? Title Patient will demo correct use of LRAD when ambulation for reduced risk for future falls.   ? Time 6   ? Period Weeks   ? Status N/A  ? Target Date 03/23/22   ? ?  ?  ? ?  ? ? ? ? Plan - 03/01/22 0946   ?  ?  Clinical  Impression Statement Pt had not been contacted by the department therefore he did not think that his visits had been approved.  Pt has not been to treatment for a month.  Treatment today focused on strengthening as well and b

## 2022-04-24 ENCOUNTER — Telehealth: Payer: Self-pay | Admitting: Neurology

## 2022-04-24 NOTE — Telephone Encounter (Signed)
I called patient to discuss. No answer, left a message asking him to call us back. If patient calls back another day please route to POD 2. ?

## 2022-04-24 NOTE — Telephone Encounter (Signed)
Patient MRI of thoracic spine showed mild degenerative changes there is no evidence of spinal cord compression ? ?IMPRESSION: MRI scan of the thoracic spine without contrast showing only minor facet degenerative changes without significant compression. ?

## 2022-04-25 DIAGNOSIS — N281 Cyst of kidney, acquired: Secondary | ICD-10-CM | POA: Diagnosis not present

## 2022-04-25 DIAGNOSIS — D75839 Thrombocytosis, unspecified: Secondary | ICD-10-CM | POA: Diagnosis not present

## 2022-04-25 DIAGNOSIS — I5032 Chronic diastolic (congestive) heart failure: Secondary | ICD-10-CM | POA: Diagnosis not present

## 2022-04-25 DIAGNOSIS — R809 Proteinuria, unspecified: Secondary | ICD-10-CM | POA: Diagnosis not present

## 2022-04-25 DIAGNOSIS — E559 Vitamin D deficiency, unspecified: Secondary | ICD-10-CM | POA: Diagnosis not present

## 2022-04-25 DIAGNOSIS — N189 Chronic kidney disease, unspecified: Secondary | ICD-10-CM | POA: Diagnosis not present

## 2022-04-25 DIAGNOSIS — D72829 Elevated white blood cell count, unspecified: Secondary | ICD-10-CM | POA: Diagnosis not present

## 2022-04-25 DIAGNOSIS — D3501 Benign neoplasm of right adrenal gland: Secondary | ICD-10-CM | POA: Diagnosis not present

## 2022-04-25 DIAGNOSIS — R911 Solitary pulmonary nodule: Secondary | ICD-10-CM | POA: Diagnosis not present

## 2022-04-25 NOTE — Telephone Encounter (Signed)
I spoke to the patient. He verbalized understanding of the MRI findings.  ?

## 2022-04-26 ENCOUNTER — Telehealth (HOSPITAL_COMMUNITY): Payer: Self-pay | Admitting: Physical Therapy

## 2022-04-26 ENCOUNTER — Ambulatory Visit (HOSPITAL_COMMUNITY): Payer: Medicare HMO | Admitting: Physical Therapy

## 2022-04-26 NOTE — Telephone Encounter (Signed)
Called Pt re no show.  Reminded pt of no show policy as well as next visit.  ? ?Rayetta Humphrey, PT CLT ?[phone]  ?

## 2022-04-26 NOTE — Telephone Encounter (Signed)
Called pt re no show  ?

## 2022-04-27 ENCOUNTER — Other Ambulatory Visit (HOSPITAL_COMMUNITY): Payer: Self-pay | Admitting: Nephrology

## 2022-04-27 ENCOUNTER — Other Ambulatory Visit: Payer: Self-pay | Admitting: Nephrology

## 2022-04-27 DIAGNOSIS — D75839 Thrombocytosis, unspecified: Secondary | ICD-10-CM

## 2022-04-27 DIAGNOSIS — E1122 Type 2 diabetes mellitus with diabetic chronic kidney disease: Secondary | ICD-10-CM

## 2022-04-27 DIAGNOSIS — D72829 Elevated white blood cell count, unspecified: Secondary | ICD-10-CM

## 2022-04-27 DIAGNOSIS — D3501 Benign neoplasm of right adrenal gland: Secondary | ICD-10-CM

## 2022-04-28 ENCOUNTER — Telehealth (HOSPITAL_COMMUNITY): Payer: Self-pay | Admitting: Physical Therapy

## 2022-04-28 ENCOUNTER — Ambulatory Visit (HOSPITAL_COMMUNITY): Payer: Medicare HMO | Admitting: Physical Therapy

## 2022-04-28 NOTE — Telephone Encounter (Signed)
No show #2. Called patient about missed visits and gave reminder of next appt on 05/03/22.  ? ?9:55 AM, 04/28/22 ?Josue Hector PT DPT  ?Physical Therapist with [facility]  ?[facility]  ?[phone] ? ?

## 2022-05-03 ENCOUNTER — Encounter (HOSPITAL_COMMUNITY): Payer: Self-pay | Admitting: Physical Therapy

## 2022-05-03 ENCOUNTER — Ambulatory Visit (HOSPITAL_COMMUNITY): Payer: Medicare HMO | Admitting: Physical Therapy

## 2022-05-03 DIAGNOSIS — R262 Difficulty in walking, not elsewhere classified: Secondary | ICD-10-CM | POA: Diagnosis not present

## 2022-05-03 DIAGNOSIS — R42 Dizziness and giddiness: Secondary | ICD-10-CM | POA: Diagnosis not present

## 2022-05-03 DIAGNOSIS — R296 Repeated falls: Secondary | ICD-10-CM

## 2022-05-03 NOTE — Therapy (Signed)
?OUTPATIENT PHYSICAL THERAPY TREATMENT NOTE ? ? ?Patient Name: Ryan Mccarty ?MRN: [id number] ?DOB:[birth year], 71 y.o., male ?Today's Date: 05/03/2022 ?PHYSICAL THERAPY DISCHARGE SUMMARY ? ?Visits from Start of Care: 12 ? ?Current functional level related to goals / functional outcomes: ?See below  ?  ?Remaining deficits: ?See below  ?  ?Education / Equipment: ?See assessment   ? ?Patient agrees to discharge. Patient goals were not met. Patient is being discharged due to lack of progress. ? ?PCP: [place] Nation, MD ?Elmon Kirschner, MD  ? ? ? 05/03/22 0955  ?PT Visits / Re-Eval  ?Visit Number 12  ?Number of Visits 22  ?Date for PT Re-Evaluation 05/04/22  ?Authorization  ?Authorization Type [employer]  ?Authorization Time Period 12 visits approved from 03/28/2022 thru 05/26/2022  ?Authorization - Visit Number 2  ?Authorization - Number of Visits 12  ?Progress Note Due on Visit 22  ?PT Time Calculation  ?PT Start Time [phone]  ?PT Stop Time 1028  ?PT Time Calculation (min) 38 min  ?PT - End of Session  ?Equipment Utilized During Treatment Gait belt  ?Activity Tolerance Patient tolerated treatment well  ?Behavior During Therapy [facility] for tasks assessed/performed  ? ? ?Past Medical History:  ?Diagnosis Date  ? Diabetes mellitus without complication ([place])   ? ?Past Surgical History:  ?Procedure Laterality Date  ? CYSTECTOMY    ? TONSILLECTOMY    ? ?Patient Active Problem List  ? Diagnosis Date Noted  ? Gait abnormality 04/13/2022  ? Fall 04/13/2022  ? Urinary incontinence 04/13/2022  ? 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 Other elevated white blood cell count 01/21/2020  ? Stage 3a chronic kidney disease ([place]) 07/16/2019  ? Type 2 diabetes mellitus ([place]) 07/16/2019  ? Acute renal failure syndrome ([place]) 01/15/2019  ? Arrhythmia 01/15/2019  ? Essential hypertension 01/15/2019  ? Hyperkalemia 01/15/2019  ? Mixed hyperlipidemia 01/15/2019  ? Vitamin D deficiency  01/15/2019  ? ? ?REFERRING DIAG: dizziness  ? ?THERAPY DIAG:  ?Dizziness and giddiness ? ?Difficulty in walking, not elsewhere classified ? ?Repeated falls ? ?PERTINENT HISTORY: Diagnostic imaging (MRI brain): "IMPRESSION: ?Mildly motion degraded exam. ?  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 ?  ?Background chronic small vessel ischemic changes which are advanced ?in the cerebral white matter, and mild in the pons. ?  ?Prominent perivascular space versus chronic lacunar infarct within ?the left thalamus. ?  ?Mild generalized cerebral and cerebellar atrophy." ? ?PRECAUTIONS:  Fall ? ?SUBJECTIVE:  Patient reports nothing new. He says he has had a lot of tests lately but no results. He says he still has good days and bad days.  ? ?PAIN:  ?Are you having pain? Yes: NPRS scale: 4/10 ?Pain location: RT hip ?Pain description: aching, sharp  ?Aggravating factors: night time  ?Relieving factors: stretching  ? ? ? ?TODAY'S TREATMENT:  ? ?05/03/22 ? Reassess ? ? ? 05/03/22 0001  ?Assessment  ?Medical Diagnosis Dizziness/ gait  ?Referring Provider (PT) Leta Baptist MD  ?Precautions  ?Precautions Fall  ?Balance Screen  ?Has the patient fallen in the past 6 months Yes  ?Home Environment  ?Living Environment Private residence  ?Living Arrangements Spouse/significant other  ?Prior Function  ?Level of Independence Independent  ?Cognition  ?Overall Cognitive Status Within Functional Limits for tasks assessed  ?Strength  ?Left Knee Extension 4/5  ?Right Hip Flexion 4/5  ?Left Hip Flexion 4/5  ?Right Knee Extension  4/5  ?Right Ankle Dorsiflexion 5/5  ?Left Ankle Dorsiflexion 5/5  ?Ambulation/Gait  ?Ambulation/Gait Yes  ?Ambulation/Gait Assistance 4: Min guard  ?Assistive device None  ?Ambulation Surface Level;Indoor  ?Gait Comments Decreased stride, forward pulsion, shuffling gait, walks on toes  ?Dynamic Gait Index  ?Level Surface 1  ?Change in Gait  Speed 1  ?Gait with Horizontal Head Turns 1  ?Gait with Vertical Head Turns 1  ?Gait and Pivot Turn 1  ?Step Over Obstacle 2  ?Step Around Obstacles 2  ?Steps 2  ?Total Score 11  ? ? ? ?04/20/2022: ?Nustep:  hills 3 for 5 minutes ?Sit:         sit to stand x 15 ?              Leg press 3 PL x 10  ?              Knee extension 1 PL x 5 x 2 reps ?Stand:   functional squat with yellow ball pushing out then come to heel raise with B UE flexion with ball x 15 ?              Toe raises x 15 ?              Tandem stance with head turns B x 3 reps  ?              Single leg stance B x 3 rep  ?              Tandem gt x 2 Reps  ? ? ?PATIENT EDUCATION: ?Education details: on progress, need for AD and follow up with neuro  ?Person educated: Patient ?Education method: Explanation and Demonstration ?Education comprehension: verbalized understanding and returned demonstration ? ? ?HOME EXERCISE PROGRAM: ?cervical rotation, sit to stand, sitting posture correction, sitting hip abduction with green tband x 10  ? ? PT Short Term Goals - 02/15/22 1030   ? ?  ? PT SHORT TERM GOAL #1  ? Title Patient will be independent with initial HEP and self-management strategies to improve functional outcomes   ? Time 3   ? Period Weeks   ? Status MET  ? Target Date 03/02/22   ?  ? PT SHORT TERM GOAL #2  ? Title Patient will improve DGI by at least 5 points to demo improved balance and reduced risk for falls.   ? Time 3   ? Period Weeks   ? Status NOT MET  ? Target Date 03/02/22   ? ?  ?  ? ?  ? ? ? PT Long Term Goals - 02/15/22 1030   ? ?  ? PT LONG TERM GOAL #1  ? Title Patient will improve DGI by at least 10 points to demo improved balance and reduced risk for falls.   ? Time 6   ? Period Weeks   ? Status NOT MET  ? Target Date 03/23/22   ?  ? PT LONG TERM GOAL #2  ? Title Patient will have equal to or > 4+/5 MMT throughout BLE to improve ability to perform functional mobility, stair ambulation and ADLs.   ? Time 6   ? Period Weeks   ? Status  Previously MET, but shows decreased strength today 05/03/22  ? Target Date 03/23/22   ?  ? PT LONG TERM GOAL #3  ? Title Patient will demo correct use of LRAD when ambulation for reduced risk for future falls.   ?  Time 6   ? Period Weeks   ? Status NOT MET (patient non compliant with AD use)   ? Target Date 03/23/22   ? ?  ?  ? ?  ? ? ? ? 05/03/22  ?  ?  Clinical Impression Statement Patient returns today for reassessment. He shows minimal improvement since last assessment. Manual strength test show decreased function. Gait abnormalities are largely unchanged. This appears to be highly variable, consistent with patient report of "good and bad days". At this point, I do not believe there is much more we can do for this patient beyond what he has previously been instructed to do I.e. HEP, using walker. He remains a very high falls risk. I directly instructed him to start using a walker for safety. Patient would benefit from neuro follow up for further evaluation. DC to day due to max benefit reached. Encouraged patient to follow up with therapy services with any further questions or concerns.   ?  Examination-Activity Limitations Locomotion Level;Stairs;Transfers;Stand   ?  Examination-Participation Restrictions Management consultant;Shop   ?  Rehab Potential Fair   ?  PT Frequency 2x / week   ?  PT Duration 6 weeks  recommend continuing for six  additional weeks.  ?  PT Treatment/Interventions ADLs/Self Care Home Management;Aquatic Therapy;Iontophoresis 68m/ml Dexamethasone;Moist Heat;Stair training;Gait training;DME Instruction;Traction;Canalith Repostioning;Ultrasound;Functional mobility training;Therapeutic activities;Parrafin;Fluidtherapy;Cryotherapy;Electrical Stimulation;Contrast Bath;Therapeutic exercise;Patient/family education;Orthotic Fit/Training;Manual lymph drainage;Compression bandaging;Splinting;Taping;Vasopneumatic Device;Joint Manipulations;Spinal Manipulations;Dry needling;Manual  techniques;Neuromuscular re-education;Scar mobilization;Balance training;Vestibular;Passive range of motion;Visual/perceptual remediation/compensation;Energy conservation   ?  PT Next Visit Plan DC to HEP   ?  ? ?11:08 AM,

## 2022-05-03 NOTE — Progress Notes (Signed)
?   05/03/22 0001  ?Assessment  ?Medical Diagnosis Dizziness/ gait  ?Referring Provider (PT) Leta Baptist MD  ?Precautions  ?Precautions Fall  ?Balance Screen  ?Has the patient fallen in the past 6 months Yes  ?Home Environment  ?Living Environment Private residence  ?Living Arrangements Spouse/significant other  ?Prior Function  ?Level of Independence Independent  ?Cognition  ?Overall Cognitive Status Within Functional Limits for tasks assessed  ?Strength  ?Left Knee Extension 4/5  ?Right Hip Flexion 4/5  ?Left Hip Flexion 4/5  ?Right Knee Extension 4/5  ?Right Ankle Dorsiflexion 5/5  ?Left Ankle Dorsiflexion 5/5  ?Ambulation/Gait  ?Ambulation/Gait Yes  ?Ambulation/Gait Assistance 4: Min guard  ?Assistive device None  ?Ambulation Surface Level;Indoor  ?Gait Comments Decreased stride, forward pulsion, shuffling gait, walks on toes  ?Dynamic Gait Index  ?Level Surface 1  ?Change in Gait Speed 1  ?Gait with Horizontal Head Turns 1  ?Gait with Vertical Head Turns 1  ?Gait and Pivot Turn 1  ?Step Over Obstacle 2  ?Step Around Obstacles 2  ?Steps 2  ?Total Score 11  ? ? ?

## 2022-05-04 ENCOUNTER — Ambulatory Visit (HOSPITAL_COMMUNITY)
Admission: RE | Admit: 2022-05-04 | Discharge: 2022-05-04 | Disposition: A | Discharge status: Home / Self Care | Payer: Medicare HMO | Source: Ambulatory Visit | Attending: Nephrology | Admitting: Nephrology

## 2022-05-04 DIAGNOSIS — D75839 Thrombocytosis, unspecified: Secondary | ICD-10-CM | POA: Insufficient documentation

## 2022-05-04 DIAGNOSIS — N281 Cyst of kidney, acquired: Secondary | ICD-10-CM | POA: Insufficient documentation

## 2022-05-04 DIAGNOSIS — N189 Chronic kidney disease, unspecified: Secondary | ICD-10-CM | POA: Diagnosis not present

## 2022-05-04 DIAGNOSIS — N400 Enlarged prostate without lower urinary tract symptoms: Secondary | ICD-10-CM | POA: Diagnosis not present

## 2022-05-04 DIAGNOSIS — Z8679 Personal history of other diseases of the circulatory system: Secondary | ICD-10-CM | POA: Diagnosis not present

## 2022-05-04 DIAGNOSIS — D72829 Elevated white blood cell count, unspecified: Secondary | ICD-10-CM | POA: Insufficient documentation

## 2022-05-04 DIAGNOSIS — J449 Chronic obstructive pulmonary disease, unspecified: Secondary | ICD-10-CM | POA: Diagnosis not present

## 2022-05-04 DIAGNOSIS — R351 Nocturia: Secondary | ICD-10-CM | POA: Diagnosis not present

## 2022-05-04 DIAGNOSIS — D3501 Benign neoplasm of right adrenal gland: Secondary | ICD-10-CM | POA: Diagnosis not present

## 2022-05-04 DIAGNOSIS — I739 Peripheral vascular disease, unspecified: Secondary | ICD-10-CM | POA: Diagnosis not present

## 2022-05-04 DIAGNOSIS — E119 Type 2 diabetes mellitus without complications: Secondary | ICD-10-CM | POA: Diagnosis not present

## 2022-05-04 DIAGNOSIS — I10 Essential (primary) hypertension: Secondary | ICD-10-CM | POA: Diagnosis not present

## 2022-05-04 DIAGNOSIS — E1122 Type 2 diabetes mellitus with diabetic chronic kidney disease: Secondary | ICD-10-CM | POA: Diagnosis not present

## 2022-05-04 DIAGNOSIS — R270 Ataxia, unspecified: Secondary | ICD-10-CM | POA: Diagnosis not present

## 2022-05-04 IMAGING — US US RENAL
1 series · 14 of 25 positions shown · non-contrast
Comparison: PET CT [DATE], abdominal CT [DATE]

CLINICAL DATA: Chronic kidney disease due to type 2 diabetes
mellitus. Leukocytosis. Thrombocytosis. Adrenal adenoma.

EXAM:
RENAL / URINARY TRACT ULTRASOUND COMPLETE

[Series 1: us renal · 14 of 54 slices shown]
[im 1/54]
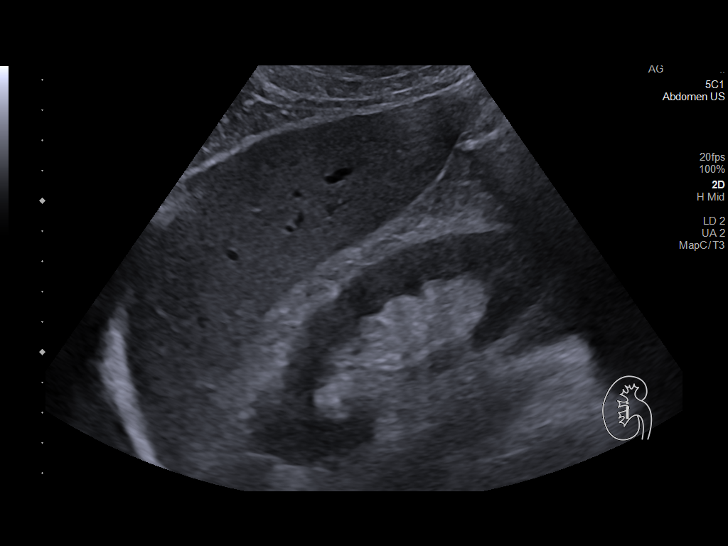
[im 5/54]
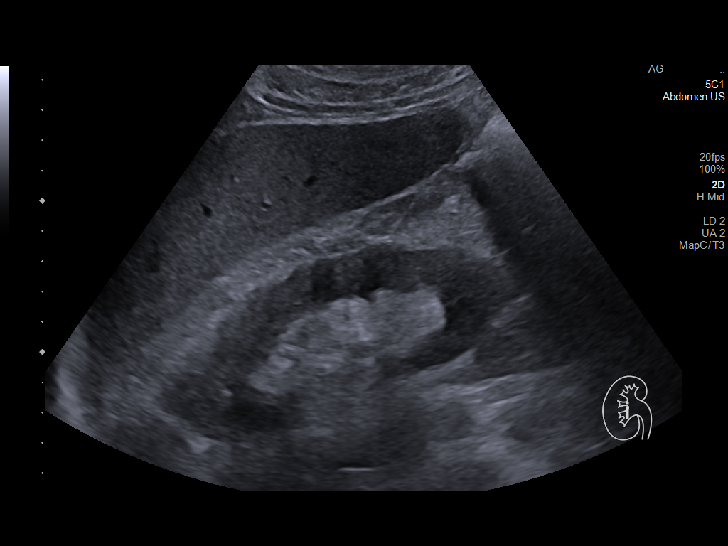
[im 9/54]
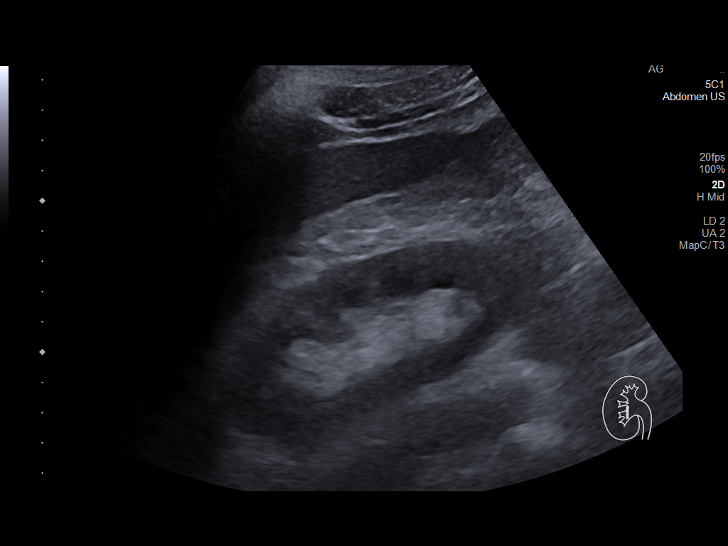
[im 14/54]
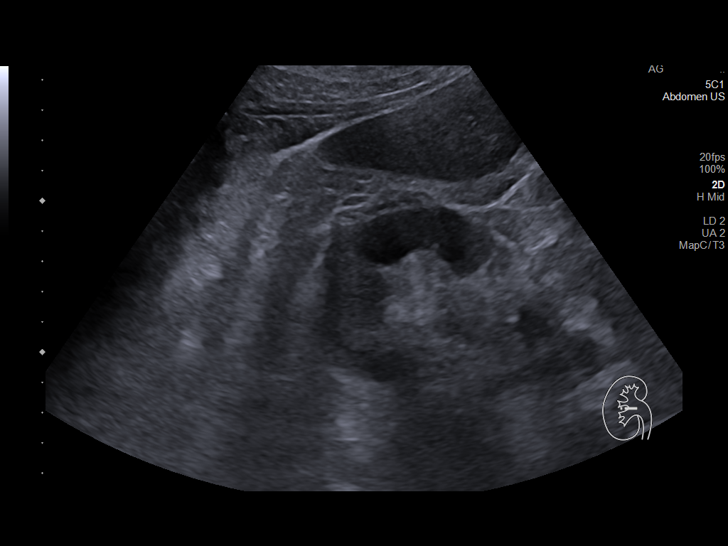
[im 18/54]
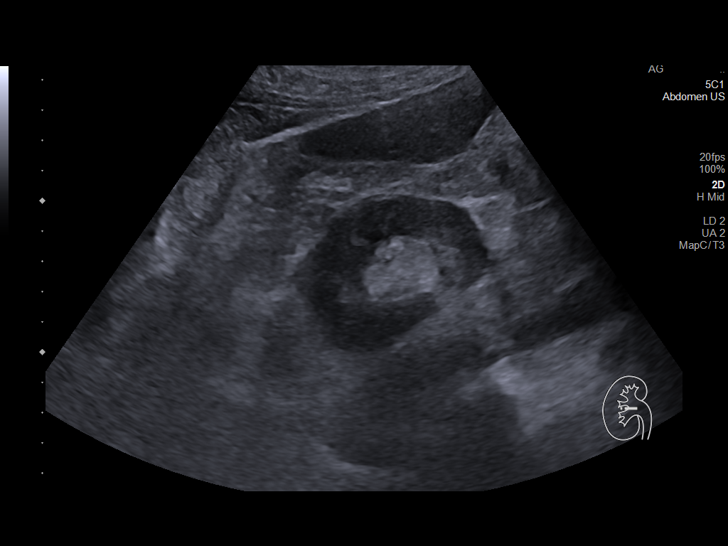
[im 20/54]
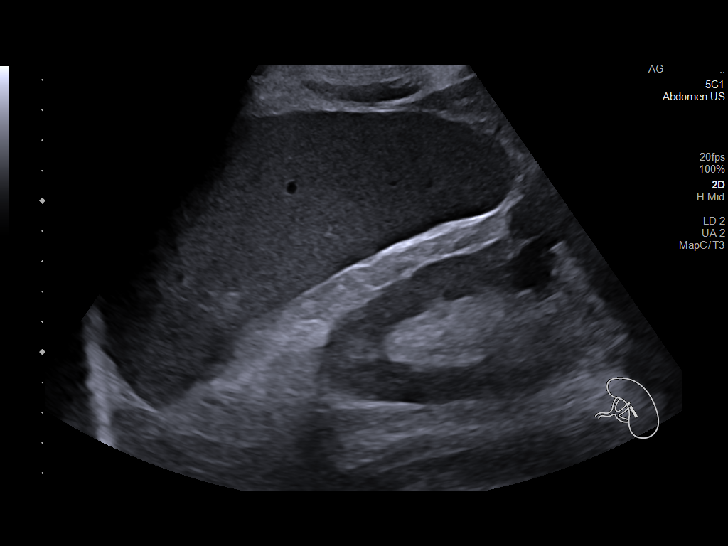
[im 25/54]
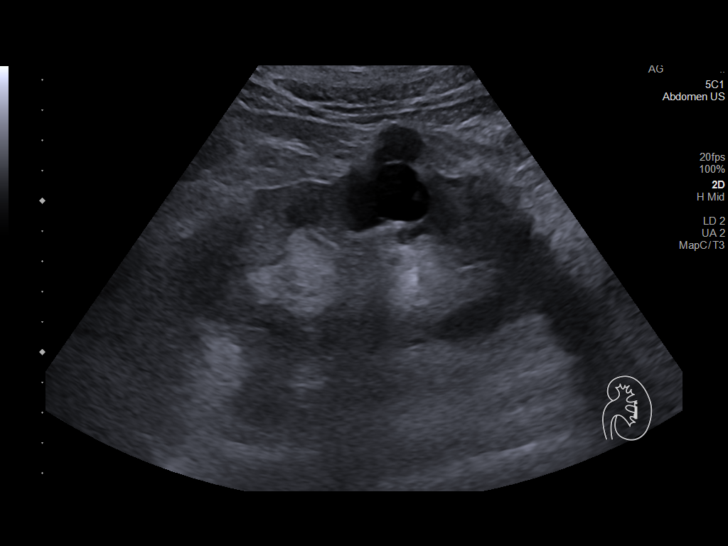
[im 29/54]
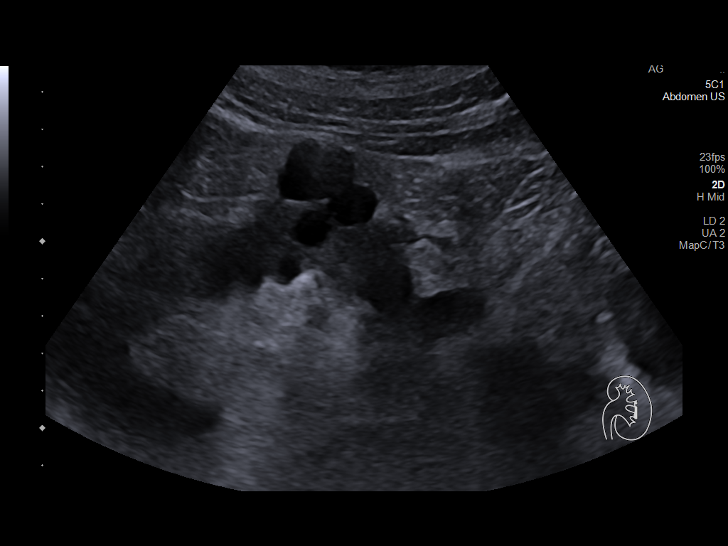
[im 34/54]
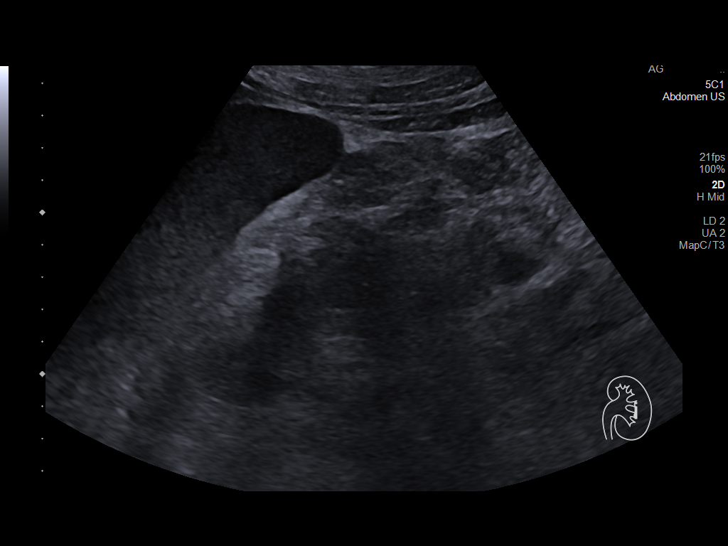
[im 36/54]
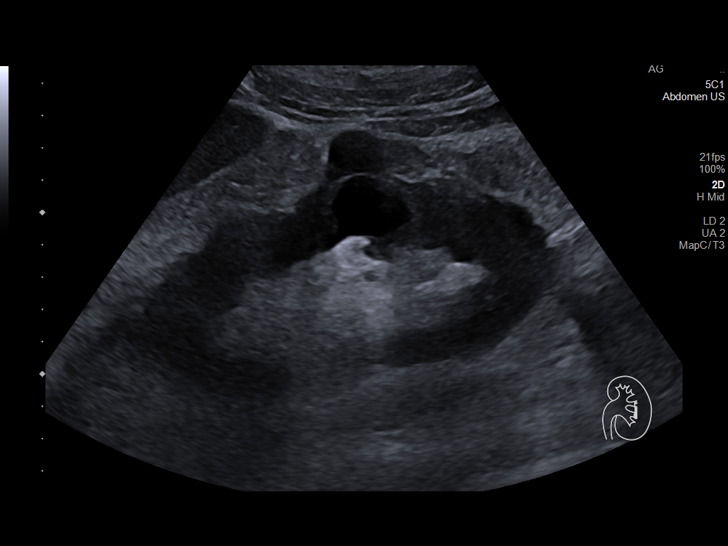
[im 40/54]
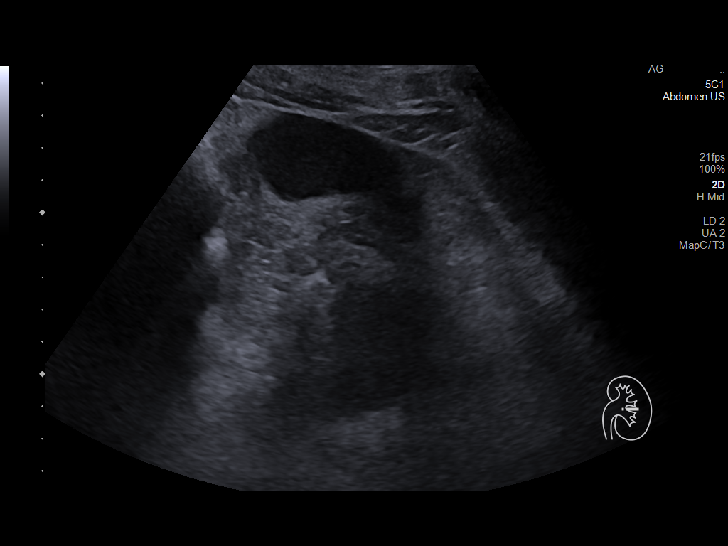
[im 45/54]
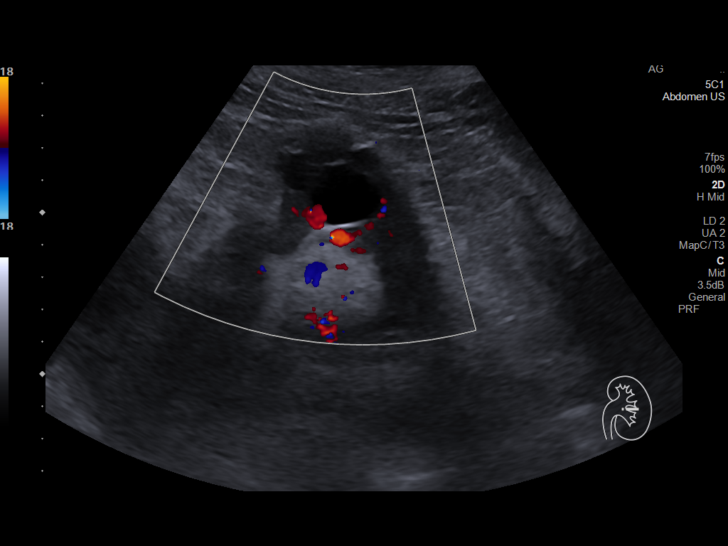
[im 49/54]
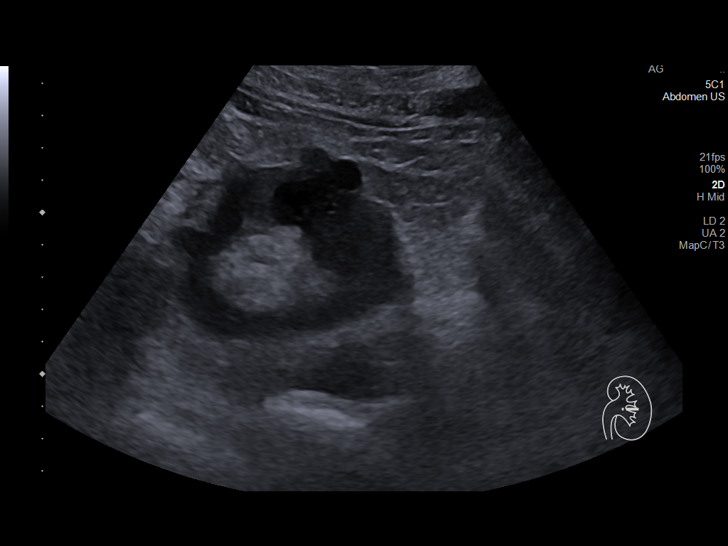
[im 54/54]
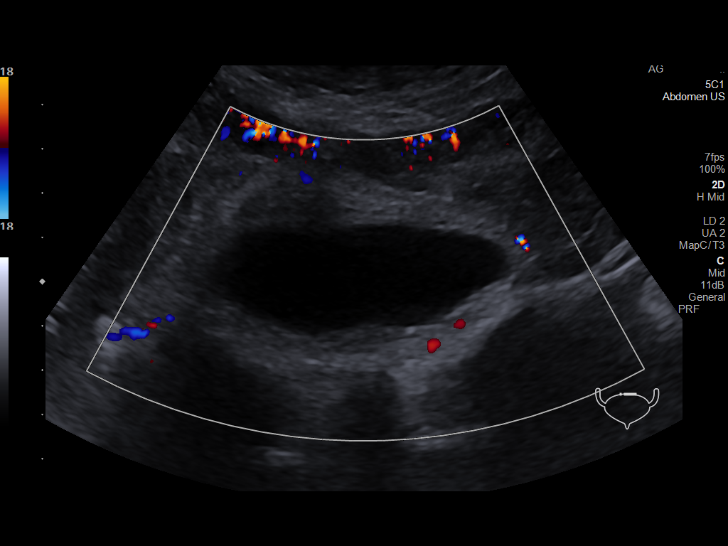

[14 of 25 positions shown; findings below may reference images not displayed]

FINDINGS: Right Kidney:

Renal measurements: 11.9 x 5.1 x 6.1 cm = volume: 194 mL. Slight
increased parenchymal echogenicity. No hydronephrosis. No visualized
stone or focal lesion. Small low-density lesion in the upper pole on
prior CT is not seen by ultrasound.

Left Kidney:

Renal measurements: 14 x 6.2 x 6.7 cm = volume: 306 mL. There are 2
adjacent cysts in the mid kidney measuring 2.7 x 2.2 x 2.3 cm and
2.2 x 1.8 x 2.1 cm. Smaller adjacent cysts. No solid lesion or
internal complexity. No renal calculi. Trace perinephric fluid,
chronic based on prior CT.

Bladder:

Appears normal for degree of bladder distention.

Other:

None.
IMPRESSION: 1. Left renal cysts without complexity or evidence of solid lesion,
needing no further follow-up.
2. Mild increased right renal parenchymal echogenicity suggestive of
mild chronic medical renal disease.

## 2022-05-05 ENCOUNTER — Encounter (HOSPITAL_COMMUNITY): Payer: Medicare HMO | Admitting: Physical Therapy

## 2022-05-17 ENCOUNTER — Encounter (HOSPITAL_COMMUNITY): Payer: Medicare HMO | Admitting: Physical Therapy

## 2022-05-19 ENCOUNTER — Encounter (HOSPITAL_COMMUNITY): Payer: Medicare HMO | Admitting: Physical Therapy

## 2022-05-19 DIAGNOSIS — R4189 Other symptoms and signs involving cognitive functions and awareness: Secondary | ICD-10-CM | POA: Diagnosis not present

## 2022-05-19 DIAGNOSIS — Z87891 Personal history of nicotine dependence: Secondary | ICD-10-CM | POA: Diagnosis not present

## 2022-05-19 DIAGNOSIS — D471 Chronic myeloproliferative disease: Secondary | ICD-10-CM | POA: Diagnosis not present

## 2022-05-19 DIAGNOSIS — I10 Essential (primary) hypertension: Secondary | ICD-10-CM | POA: Diagnosis not present

## 2022-05-19 DIAGNOSIS — D45 Polycythemia vera: Secondary | ICD-10-CM | POA: Diagnosis not present

## 2022-05-19 DIAGNOSIS — D72829 Elevated white blood cell count, unspecified: Secondary | ICD-10-CM | POA: Diagnosis not present

## 2022-05-19 DIAGNOSIS — D75839 Thrombocytosis, unspecified: Secondary | ICD-10-CM | POA: Diagnosis not present

## 2022-05-19 DIAGNOSIS — R911 Solitary pulmonary nodule: Secondary | ICD-10-CM | POA: Diagnosis not present

## 2022-05-19 DIAGNOSIS — N1831 Chronic kidney disease, stage 3a: Secondary | ICD-10-CM | POA: Diagnosis not present

## 2022-05-22 ENCOUNTER — Ambulatory Visit: Payer: Medicare HMO | Admitting: Pulmonary Disease

## 2022-05-22 ENCOUNTER — Encounter: Payer: Self-pay | Admitting: Pulmonary Disease

## 2022-05-22 ENCOUNTER — Ambulatory Visit (INDEPENDENT_AMBULATORY_CARE_PROVIDER_SITE_OTHER): Payer: Medicare HMO | Admitting: Pulmonary Disease

## 2022-05-22 VITALS — BP 126/76 | HR 73 | Temp 98.0°F | Ht 71.0 in | Wt 185.2 lb

## 2022-05-22 DIAGNOSIS — J438 Other emphysema: Secondary | ICD-10-CM

## 2022-05-22 DIAGNOSIS — R9389 Abnormal findings on diagnostic imaging of other specified body structures: Secondary | ICD-10-CM

## 2022-05-22 LAB — PULMONARY FUNCTION TEST
DL/VA % pred: 77 %
DL/VA: 3.12 ml/min/mmHg/L
DLCO cor % pred: 58 %
DLCO cor: 15.75 ml/min/mmHg
DLCO unc % pred: 60 %
DLCO unc: 16.38 ml/min/mmHg
FEF 25-75 Post: 0.86 L/sec
FEF 25-75 Pre: 0.81 L/sec
FEF2575-%Change-Post: 6 %
FEF2575-%Pred-Post: 32 %
FEF2575-%Pred-Pre: 30 %
FEV1-%Change-Post: 2 %
FEV1-%Pred-Post: 52 %
FEV1-%Pred-Pre: 50 %
FEV1-Post: 1.78 L
FEV1-Pre: 1.73 L
FEV1FVC-%Change-Post: 3 %
FEV1FVC-%Pred-Pre: 72 %
FEV6-%Change-Post: 0 %
FEV6-%Pred-Post: 71 %
FEV6-%Pred-Pre: 71 %
FEV6-Post: 3.14 L
FEV6-Pre: 3.14 L
FEV6FVC-%Change-Post: 0 %
FEV6FVC-%Pred-Post: 102 %
FEV6FVC-%Pred-Pre: 101 %
FVC-%Change-Post: 0 %
FVC-%Pred-Post: 70 %
FVC-%Pred-Pre: 70 %
FVC-Post: 3.24 L
FVC-Pre: 3.26 L
Post FEV1/FVC ratio: 55 %
Post FEV6/FVC ratio: 97 %
Pre FEV1/FVC ratio: 53 %
Pre FEV6/FVC Ratio: 96 %
RV % pred: 133 %
RV: 3.31 L
TLC % pred: 90 %
TLC: 6.51 L

## 2022-05-22 MED ORDER — BUDESONIDE-FORMOTEROL FUMARATE 160-4.5 MCG/ACT IN AERO: 2.0000 | INHALATION_SPRAY | Freq: Two times a day (BID) | RESPIRATORY_TRACT | 6 refills | Status: AC

## 2022-05-22 MED ORDER — ALBUTEROL SULFATE HFA 108 (90 BASE) MCG/ACT IN AERS: 2.0000 | INHALATION_SPRAY | RESPIRATORY_TRACT | 3 refills | Status: DC | PRN

## 2022-05-22 NOTE — Progress Notes (Signed)
Ryan Mccarty    [id number]    [birth year]  Primary Care Physician:Williams, Drema Dallas, MD  Referring Physician: Bonnita Hollow, [place],  [place] [address]  Chief complaint:    Patient with a history of emphysema, shortness of breath  HPI:  Was recently seen by oncology, a PET scan has been ordered to assess lung nodules and activity-PET shows no significant activity in lung nodules  Shortness of breath on exertion  At some point did have a stroke, exercise limitation, shortness of breath with minimal exertion Currently participating in rehab  An active smoker about a pack a day, did try to quit in the past, at some point was on Chantix and relates that he may have passed out related to Chantix  We did discuss the CT scan findings of extensive emphysema, risk with continuing to smoke, risk of neoplastic process with his smoking  Symbicort seems to be helping his breathing at present  Recently abnormal CT with multiple lung nodules and some abnormalities also noted on his kidney and adrenals-PET scan has been ordered and pending  Outpatient Encounter Medications as of 05/22/2022  Medication Sig   albuterol (VENTOLIN HFA) 108 (90 Base) MCG/ACT inhaler SMARTSIG:2 Puff(s) Via Inhaler 4 Times Daily PRN   aspirin 81 MG chewable tablet Chew 1 tablet (81 mg total) by mouth daily.   Budesonide-Formoterol Fumarate (SYMBICORT IN) Inhale into the lungs.   glipiZIDE (GLUCOTROL) 5 MG tablet Take 5 mg by mouth daily.   No facility-administered encounter medications on file as of 05/22/2022.    Allergies as of 05/22/2022   (No Known Allergies)    Past Medical History:  Diagnosis Date   Diabetes mellitus without complication ([place])     Past Surgical History:  Procedure Laterality Date   CYSTECTOMY     TONSILLECTOMY      Family History  Problem Relation Age of Onset   Cancer Father     Social History   Socioeconomic History   Marital  status: Married    Spouse name: Not on file   Number of children: Not on file   Years of education: Not on file   Highest education level: Not on file  Occupational History   Not on file  Tobacco Use   Smoking status: Every Day    Packs/day: 0.50    Types: Cigarettes    Passive exposure: Never   Smokeless tobacco: Never  Vaping Use   Vaping Use: Never used  Substance and Sexual Activity   Alcohol use: Never   Drug use: Never   Sexual activity: Not on file  Other Topics Concern   Not on file  Social History Narrative   Not on file   Social Determinants of Health   Financial Resource Strain: Not on file  Food Insecurity: Not on file  Transportation Needs: Not on file  Physical Activity: Not on file  Stress: Not on file  Social Connections: Not on file  Intimate Partner Violence: Not on file    Review of Systems  Constitutional:  Negative for fatigue.  Respiratory:  Positive for shortness of breath.    Vitals:   05/22/22 1556  BP: 126/76  Pulse: 73  Temp: 98 F (36.7 C)  SpO2: 98%     Physical Exam Constitutional:      Appearance: Normal appearance.  HENT:     Head: Normocephalic.     Mouth/Throat:  Mouth: Mucous membranes are moist.  Cardiovascular:     Rate and Rhythm: Normal rate and regular rhythm.     Heart sounds: No murmur heard.   No friction rub.  Pulmonary:     Effort: No respiratory distress.     Breath sounds: No stridor. No wheezing or rhonchi.  Musculoskeletal:     Cervical back: No rigidity or tenderness.  Neurological:     Mental Status: He is alert.  Psychiatric:        Mood and Affect: Mood normal.     Data Reviewed: Most recent CT scan of the chest reviewed with the patient-no previous CT to compare current 1 with-PET scan does reveal extensive emphysema and multiple lung nodules there is also Left renal cystic lesion, right adrenal nodule  PFT shows moderate obstructive disease Assessment:  Multiple lung nodules -PET  scan did not show significant activity in the lung nodules  Chronic obstructive pulmonary disease/emphysema -Has been stable with use of Symbicort and albuterol as needed  The importance and impact of continuing to smoke discussed  He did get defensive about the smoking and stated that he is planning on quitting  Deconditioning start muscle weakness -He will continue with pulmonary rehab  He does have an appointment following up with the oncologist To have repeat CT in 6 months  Plan/Recommendations: Continue current inhalers  Continue current dose of Symbicort  Smoking cessation counseling  I will see him back in 6 months  He will have a CT scan repeated in 6 months    Sherrilyn Rist MD [facility] 05/22/2022, 4:39 PM  CC: Bonnita Hollow, MD

## 2022-05-22 NOTE — Progress Notes (Signed)
PFT done today. 

## 2022-05-22 NOTE — Patient Instructions (Signed)
I will see you about 6 months from here  Prescription for Symbicort and albuterol be sent to pharmacy for you  Call with significant concerns

## 2022-05-25 ENCOUNTER — Ambulatory Visit: Payer: Medicare HMO | Admitting: Urology

## 2022-05-25 DIAGNOSIS — I5032 Chronic diastolic (congestive) heart failure: Secondary | ICD-10-CM | POA: Diagnosis not present

## 2022-05-25 DIAGNOSIS — D75839 Thrombocytosis, unspecified: Secondary | ICD-10-CM | POA: Diagnosis not present

## 2022-05-25 DIAGNOSIS — E1122 Type 2 diabetes mellitus with diabetic chronic kidney disease: Secondary | ICD-10-CM | POA: Diagnosis not present

## 2022-05-25 DIAGNOSIS — N281 Cyst of kidney, acquired: Secondary | ICD-10-CM | POA: Diagnosis not present

## 2022-05-25 DIAGNOSIS — N189 Chronic kidney disease, unspecified: Secondary | ICD-10-CM | POA: Diagnosis not present

## 2022-05-25 DIAGNOSIS — D72829 Elevated white blood cell count, unspecified: Secondary | ICD-10-CM | POA: Diagnosis not present

## 2022-05-25 NOTE — Progress Notes (Deleted)
   Assessment: 1. Renal cyst     Plan: ***  Chief Complaint: No chief complaint on file.   History of Present Illness:  Ryan Mccarty is a 71 y.o. year old male who is seen in consultation from Ulice Bold, MD for evaluation of renal cyst.  CT imaging from 3/23 demonstrated a 8 mm exophytic low-density lesion in superior pole of right kidney, a left renal midpole adjacent cyst versus larger complex cyst, additional anterior left mid to lower pole 1.5 cm low-attenuation lesion that does not qualify as a simple cyst by density criteria. Renal ultrasound from 05/04/2022 demonstrated no hydronephrosis, 2 adjacent cyst in the mid left kidney with smaller adjacent cyst, no solid lesion or internal complexity noted  Past Medical History:  Past Medical History:  Diagnosis Date   Diabetes mellitus without complication ([place])     Past Surgical History:  Past Surgical History:  Procedure Laterality Date   CYSTECTOMY     TONSILLECTOMY      Allergies:  No Known Allergies  Family History:  Family History  Problem Relation Age of Onset   Cancer Father     Social History:  Social History   Tobacco Use   Smoking status: Every Day    Packs/day: 0.50    Types: Cigarettes    Passive exposure: Never   Smokeless tobacco: Never  Vaping Use   Vaping Use: Never used  Substance Use Topics   Alcohol use: Never   Drug use: Never    Review of symptoms:  Constitutional:  Negative for unexplained weight loss, night sweats, fever, chills ENT:  Negative for nose bleeds, sinus pain, painful swallowing CV:  Negative for chest pain, shortness of breath, exercise intolerance, palpitations, loss of consciousness Resp:  Negative for cough, wheezing, shortness of breath GI:  Negative for nausea, vomiting, diarrhea, bloody stools GU:  Positives noted in HPI; otherwise negative for gross hematuria, dysuria, urinary incontinence Neuro:  Negative for seizures, poor balance, limb weakness,  slurred speech Psych:  Negative for lack of energy, depression, anxiety Endocrine:  Negative for polydipsia, polyuria, symptoms of hypoglycemia (dizziness, hunger, sweating) Hematologic:  Negative for anemia, purpura, petechia, prolonged or excessive bleeding, use of anticoagulants  Allergic:  Negative for difficulty breathing or choking as a result of exposure to anything; no shellfish allergy; no allergic response (rash/itch) to materials, foods  Physical exam: There were no vitals taken for this visit. GENERAL APPEARANCE:  Well appearing, well developed, well nourished, NAD HEENT: Atraumatic, Normocephalic, oropharynx clear. NECK: Supple without lymphadenopathy or thyromegaly. LUNGS: Clear to auscultation bilaterally. HEART: Regular Rate and Rhythm without murmurs, gallops, or rubs. ABDOMEN: Soft, non-tender, No Masses. EXTREMITIES: Moves all extremities well.  Without clubbing, cyanosis, or edema. NEUROLOGIC:  Alert and oriented x 3, normal gait, CN II-XII grossly intact.  MENTAL STATUS:  Appropriate. BACK:  Non-tender to palpation.  No CVAT SKIN:  Warm, dry and intact.    Results: No results found for this or any previous visit (from the past 24 hour(s)).

## 2022-05-31 ENCOUNTER — Ambulatory Visit: Payer: Medicare HMO | Admitting: Podiatry

## 2022-06-02 DIAGNOSIS — D729 Disorder of white blood cells, unspecified: Secondary | ICD-10-CM | POA: Diagnosis not present

## 2022-06-02 DIAGNOSIS — D72829 Elevated white blood cell count, unspecified: Secondary | ICD-10-CM | POA: Diagnosis not present

## 2022-06-02 DIAGNOSIS — D45 Polycythemia vera: Secondary | ICD-10-CM | POA: Diagnosis not present

## 2022-06-02 DIAGNOSIS — Z87891 Personal history of nicotine dependence: Secondary | ICD-10-CM | POA: Diagnosis not present

## 2022-06-02 DIAGNOSIS — N1831 Chronic kidney disease, stage 3a: Secondary | ICD-10-CM | POA: Diagnosis not present

## 2022-06-02 DIAGNOSIS — Z1589 Genetic susceptibility to other disease: Secondary | ICD-10-CM | POA: Diagnosis not present

## 2022-06-02 DIAGNOSIS — D471 Chronic myeloproliferative disease: Secondary | ICD-10-CM | POA: Diagnosis not present

## 2022-06-02 DIAGNOSIS — I10 Essential (primary) hypertension: Secondary | ICD-10-CM | POA: Diagnosis not present

## 2022-06-02 DIAGNOSIS — D75839 Thrombocytosis, unspecified: Secondary | ICD-10-CM | POA: Diagnosis not present

## 2022-06-16 DIAGNOSIS — Z1589 Genetic susceptibility to other disease: Secondary | ICD-10-CM | POA: Diagnosis not present

## 2022-06-16 DIAGNOSIS — D75839 Thrombocytosis, unspecified: Secondary | ICD-10-CM | POA: Diagnosis not present

## 2022-06-16 DIAGNOSIS — D471 Chronic myeloproliferative disease: Secondary | ICD-10-CM | POA: Diagnosis not present

## 2022-06-16 DIAGNOSIS — N1831 Chronic kidney disease, stage 3a: Secondary | ICD-10-CM | POA: Diagnosis not present

## 2022-06-16 DIAGNOSIS — I10 Essential (primary) hypertension: Secondary | ICD-10-CM | POA: Diagnosis not present

## 2022-06-16 DIAGNOSIS — D72829 Elevated white blood cell count, unspecified: Secondary | ICD-10-CM | POA: Diagnosis not present

## 2022-06-16 DIAGNOSIS — Z87891 Personal history of nicotine dependence: Secondary | ICD-10-CM | POA: Diagnosis not present

## 2022-06-16 DIAGNOSIS — D729 Disorder of white blood cells, unspecified: Secondary | ICD-10-CM | POA: Diagnosis not present

## 2022-06-16 DIAGNOSIS — D45 Polycythemia vera: Secondary | ICD-10-CM | POA: Diagnosis not present

## 2022-06-26 ENCOUNTER — Ambulatory Visit: Payer: Medicare HMO | Admitting: Adult Health

## 2022-07-04 DIAGNOSIS — E119 Type 2 diabetes mellitus without complications: Secondary | ICD-10-CM | POA: Diagnosis not present

## 2022-07-04 DIAGNOSIS — R351 Nocturia: Secondary | ICD-10-CM | POA: Diagnosis not present

## 2022-07-04 DIAGNOSIS — N400 Enlarged prostate without lower urinary tract symptoms: Secondary | ICD-10-CM | POA: Diagnosis not present

## 2022-07-04 DIAGNOSIS — I739 Peripheral vascular disease, unspecified: Secondary | ICD-10-CM | POA: Diagnosis not present

## 2022-07-04 DIAGNOSIS — J449 Chronic obstructive pulmonary disease, unspecified: Secondary | ICD-10-CM | POA: Diagnosis not present

## 2022-07-04 DIAGNOSIS — R270 Ataxia, unspecified: Secondary | ICD-10-CM | POA: Diagnosis not present

## 2022-07-04 DIAGNOSIS — Z8679 Personal history of other diseases of the circulatory system: Secondary | ICD-10-CM | POA: Diagnosis not present

## 2022-07-04 DIAGNOSIS — Z720 Tobacco use: Secondary | ICD-10-CM | POA: Diagnosis not present

## 2022-07-04 DIAGNOSIS — R42 Dizziness and giddiness: Secondary | ICD-10-CM | POA: Diagnosis not present

## 2022-08-01 DIAGNOSIS — R5383 Other fatigue: Secondary | ICD-10-CM | POA: Diagnosis not present

## 2022-08-01 DIAGNOSIS — E559 Vitamin D deficiency, unspecified: Secondary | ICD-10-CM | POA: Diagnosis not present

## 2022-08-01 DIAGNOSIS — Z13220 Encounter for screening for lipoid disorders: Secondary | ICD-10-CM | POA: Diagnosis not present

## 2022-08-01 DIAGNOSIS — J449 Chronic obstructive pulmonary disease, unspecified: Secondary | ICD-10-CM | POA: Diagnosis not present

## 2022-08-01 DIAGNOSIS — E119 Type 2 diabetes mellitus without complications: Secondary | ICD-10-CM | POA: Diagnosis not present

## 2022-08-07 DIAGNOSIS — Z8679 Personal history of other diseases of the circulatory system: Secondary | ICD-10-CM | POA: Diagnosis not present

## 2022-08-07 DIAGNOSIS — I739 Peripheral vascular disease, unspecified: Secondary | ICD-10-CM | POA: Diagnosis not present

## 2022-08-07 DIAGNOSIS — N400 Enlarged prostate without lower urinary tract symptoms: Secondary | ICD-10-CM | POA: Diagnosis not present

## 2022-08-07 DIAGNOSIS — E119 Type 2 diabetes mellitus without complications: Secondary | ICD-10-CM | POA: Diagnosis not present

## 2022-08-07 DIAGNOSIS — Z720 Tobacco use: Secondary | ICD-10-CM | POA: Diagnosis not present

## 2022-08-07 DIAGNOSIS — R42 Dizziness and giddiness: Secondary | ICD-10-CM | POA: Diagnosis not present

## 2022-08-07 DIAGNOSIS — J449 Chronic obstructive pulmonary disease, unspecified: Secondary | ICD-10-CM | POA: Diagnosis not present

## 2022-08-07 DIAGNOSIS — R270 Ataxia, unspecified: Secondary | ICD-10-CM | POA: Diagnosis not present

## 2022-08-07 DIAGNOSIS — R351 Nocturia: Secondary | ICD-10-CM | POA: Diagnosis not present

## 2022-08-24 DIAGNOSIS — J449 Chronic obstructive pulmonary disease, unspecified: Secondary | ICD-10-CM | POA: Diagnosis not present

## 2022-08-24 DIAGNOSIS — N400 Enlarged prostate without lower urinary tract symptoms: Secondary | ICD-10-CM | POA: Diagnosis not present

## 2022-08-24 DIAGNOSIS — Z8679 Personal history of other diseases of the circulatory system: Secondary | ICD-10-CM | POA: Diagnosis not present

## 2022-08-24 DIAGNOSIS — R270 Ataxia, unspecified: Secondary | ICD-10-CM | POA: Diagnosis not present

## 2022-08-24 DIAGNOSIS — R351 Nocturia: Secondary | ICD-10-CM | POA: Diagnosis not present

## 2022-08-24 DIAGNOSIS — R42 Dizziness and giddiness: Secondary | ICD-10-CM | POA: Diagnosis not present

## 2022-08-24 DIAGNOSIS — Z720 Tobacco use: Secondary | ICD-10-CM | POA: Diagnosis not present

## 2022-08-24 DIAGNOSIS — I739 Peripheral vascular disease, unspecified: Secondary | ICD-10-CM | POA: Diagnosis not present

## 2022-08-24 DIAGNOSIS — E119 Type 2 diabetes mellitus without complications: Secondary | ICD-10-CM | POA: Diagnosis not present

## 2022-10-05 ENCOUNTER — Telehealth: Payer: Self-pay | Admitting: *Deleted

## 2022-10-05 NOTE — Patient Outreach (Signed)
  Care Coordination   10/05/2022 Name: Ryan Mccarty MRN: [id number] DOB: 1951-06-18   Care Coordination Outreach Attempts:  An unsuccessful telephone outreach was attempted today to offer the patient information about available care coordination services as a benefit of their health plan.   Follow Up Plan:  Additional outreach attempts will be made to offer the patient care coordination information and services.   Encounter Outcome:  No Answer  Care Coordination Interventions Activated:  No   Care Coordination Interventions:  No, not indicated    Valente David, RN, MSN, [facility] Care Management Care Management Coordinator [phone]

## 2022-10-10 ENCOUNTER — Telehealth: Payer: Self-pay | Admitting: *Deleted

## 2022-10-10 NOTE — Patient Outreach (Signed)
  Care Coordination   10/10/2022 Name: Ryan Mccarty MRN: [id number] DOB: [birth year]   Care Coordination Outreach Attempts:  A second unsuccessful outreach was attempted today to offer the patient with information about available care coordination services as a benefit of their health plan.     Follow Up Plan:  Additional outreach attempts will be made to offer the patient care coordination information and services.   Encounter Outcome:  No Answer  Care Coordination Interventions Activated:  No   Care Coordination Interventions:  No, not indicated    Valente David, RN, MSN, [facility] Care Management Care Management Coordinator [phone]

## 2022-10-13 DIAGNOSIS — I129 Hypertensive chronic kidney disease with stage 1 through stage 4 chronic kidney disease, or unspecified chronic kidney disease: Secondary | ICD-10-CM | POA: Diagnosis not present

## 2022-10-13 DIAGNOSIS — J432 Centrilobular emphysema: Secondary | ICD-10-CM | POA: Diagnosis not present

## 2022-10-13 DIAGNOSIS — R918 Other nonspecific abnormal finding of lung field: Secondary | ICD-10-CM | POA: Diagnosis not present

## 2022-10-13 DIAGNOSIS — K5730 Diverticulosis of large intestine without perforation or abscess without bleeding: Secondary | ICD-10-CM | POA: Diagnosis not present

## 2022-10-13 DIAGNOSIS — N281 Cyst of kidney, acquired: Secondary | ICD-10-CM | POA: Diagnosis not present

## 2022-10-13 DIAGNOSIS — D72829 Elevated white blood cell count, unspecified: Secondary | ICD-10-CM | POA: Diagnosis not present

## 2022-10-13 DIAGNOSIS — Z87891 Personal history of nicotine dependence: Secondary | ICD-10-CM | POA: Diagnosis not present

## 2022-10-13 DIAGNOSIS — D471 Chronic myeloproliferative disease: Secondary | ICD-10-CM | POA: Diagnosis not present

## 2022-10-13 DIAGNOSIS — K8020 Calculus of gallbladder without cholecystitis without obstruction: Secondary | ICD-10-CM | POA: Diagnosis not present

## 2022-10-13 DIAGNOSIS — D3501 Benign neoplasm of right adrenal gland: Secondary | ICD-10-CM | POA: Diagnosis not present

## 2022-10-13 DIAGNOSIS — I700 Atherosclerosis of aorta: Secondary | ICD-10-CM | POA: Diagnosis not present

## 2022-10-13 DIAGNOSIS — D75839 Thrombocytosis, unspecified: Secondary | ICD-10-CM | POA: Diagnosis not present

## 2022-10-13 DIAGNOSIS — N1831 Chronic kidney disease, stage 3a: Secondary | ICD-10-CM | POA: Diagnosis not present

## 2022-10-13 DIAGNOSIS — K439 Ventral hernia without obstruction or gangrene: Secondary | ICD-10-CM | POA: Diagnosis not present

## 2022-10-13 DIAGNOSIS — R4189 Other symptoms and signs involving cognitive functions and awareness: Secondary | ICD-10-CM | POA: Diagnosis not present

## 2022-10-13 DIAGNOSIS — Z1589 Genetic susceptibility to other disease: Secondary | ICD-10-CM | POA: Diagnosis not present

## 2022-10-13 DIAGNOSIS — D45 Polycythemia vera: Secondary | ICD-10-CM | POA: Diagnosis not present

## 2022-10-20 ENCOUNTER — Ambulatory Visit: Payer: Self-pay | Admitting: *Deleted

## 2022-10-21 ENCOUNTER — Encounter: Payer: Self-pay | Admitting: *Deleted

## 2022-10-21 NOTE — Patient Instructions (Signed)
Visit Information  Thank you for taking time to visit with me today. Please don't hesitate to contact me if I can be of assistance to you.   Please call the care guide team at [phone] if you need to cancel or reschedule your appointment.   If you are experiencing a Mental Health or Behavioral Health Crisis or need someone to talk to, please call the Suicide and Crisis Lifeline: 988 call the [place] National Suicide Prevention Lifeline: 1-800-273-8255 or TTY: 1-800-799-4 TTY (1-800-799-4889) to talk to a trained counselor call 1-800-273-TALK (toll free, 24 hour hotline) go to [facility] [address], [place] ([phone]) call the [place] Crisis Line: 800-939-9988 call 911  Patient verbalizes understanding of instructions and care plan provided today and agrees to view in MyChart. Active MyChart status and patient understanding of how to access instructions and care plan via MyChart confirmed with patient.     No further follow up required.  Markelle Najarian, BSW, MSW, LCSW  Licensed Clinical Social Worker  [facility] Care Management [facility]  Mailing Address-[address], [place], [place] [address] Physical Address-[address], , [place] [address] Toll Free Main # [phone] Fax # [phone] Cell # [phone] [email or web address]            

## 2022-10-21 NOTE — Patient Outreach (Signed)
  Care Coordination   Initial Visit Note   10/21/2022  Name: Ryan Mccarty MRN: [id number] DOB: 05-17-1951  Ryan Mccarty is a 71 y.o. year old male who sees Lewiston Nation, MD for primary care. I spoke with Ryan Mccarty by phone today.  What matters to the patients health and wellness today?  No Interventions Identified.  SDOH assessments and interventions completed:  Yes.  SDOH Interventions Today    Flowsheet Row Most Recent Value  SDOH Interventions   Food Insecurity Interventions Intervention Not Indicated  Housing Interventions Intervention Not Indicated  Transportation Interventions Intervention Not Indicated  Utilities Interventions Intervention Not Indicated  Alcohol Usage Interventions Intervention Not Indicated (Score <7)  Financial Strain Interventions Intervention Not Indicated  Physical Activity Interventions Patient Refused  Stress Interventions Intervention Not Indicated  Social Connections Interventions Intervention Not Indicated     Care Coordination Interventions Activated:  Yes.   Care Coordination Interventions:  Yes, Provided.   Follow up plan: No Further Intervention Required.   Encounter Outcome:  Pt. Visit Completed.   Ryan Mccarty, BSW, MSW, LCSW  Licensed Education officer, [facility]  Mailing [place] [address], [place], [place] [address] Physical Address-[address] [place], [place] [address] Toll Free Main # [phone] Fax # [phone] Cell # [phone] [email or web address]

## 2022-10-26 DIAGNOSIS — Z125 Encounter for screening for malignant neoplasm of prostate: Secondary | ICD-10-CM | POA: Diagnosis not present

## 2022-10-26 DIAGNOSIS — N400 Enlarged prostate without lower urinary tract symptoms: Secondary | ICD-10-CM | POA: Diagnosis not present

## 2022-10-26 DIAGNOSIS — G9389 Other specified disorders of brain: Secondary | ICD-10-CM | POA: Diagnosis not present

## 2022-10-26 DIAGNOSIS — D471 Chronic myeloproliferative disease: Secondary | ICD-10-CM | POA: Diagnosis not present

## 2022-10-31 DIAGNOSIS — G9389 Other specified disorders of brain: Secondary | ICD-10-CM | POA: Diagnosis not present

## 2022-10-31 DIAGNOSIS — J449 Chronic obstructive pulmonary disease, unspecified: Secondary | ICD-10-CM | POA: Diagnosis not present

## 2022-10-31 DIAGNOSIS — R5383 Other fatigue: Secondary | ICD-10-CM | POA: Diagnosis not present

## 2022-10-31 DIAGNOSIS — I739 Peripheral vascular disease, unspecified: Secondary | ICD-10-CM | POA: Diagnosis not present

## 2022-10-31 DIAGNOSIS — E119 Type 2 diabetes mellitus without complications: Secondary | ICD-10-CM | POA: Diagnosis not present

## 2022-11-07 DIAGNOSIS — Z720 Tobacco use: Secondary | ICD-10-CM | POA: Diagnosis not present

## 2022-11-07 DIAGNOSIS — E119 Type 2 diabetes mellitus without complications: Secondary | ICD-10-CM | POA: Diagnosis not present

## 2022-11-07 DIAGNOSIS — N400 Enlarged prostate without lower urinary tract symptoms: Secondary | ICD-10-CM | POA: Diagnosis not present

## 2022-11-07 DIAGNOSIS — R270 Ataxia, unspecified: Secondary | ICD-10-CM | POA: Diagnosis not present

## 2022-11-07 DIAGNOSIS — R351 Nocturia: Secondary | ICD-10-CM | POA: Diagnosis not present

## 2022-11-07 DIAGNOSIS — I739 Peripheral vascular disease, unspecified: Secondary | ICD-10-CM | POA: Diagnosis not present

## 2022-11-07 DIAGNOSIS — J449 Chronic obstructive pulmonary disease, unspecified: Secondary | ICD-10-CM | POA: Diagnosis not present

## 2022-11-07 DIAGNOSIS — Z8679 Personal history of other diseases of the circulatory system: Secondary | ICD-10-CM | POA: Diagnosis not present

## 2022-11-07 DIAGNOSIS — R42 Dizziness and giddiness: Secondary | ICD-10-CM | POA: Diagnosis not present

## 2022-11-13 DIAGNOSIS — Z23 Encounter for immunization: Secondary | ICD-10-CM | POA: Diagnosis not present

## 2022-11-13 DIAGNOSIS — Z2911 Encounter for prophylactic immunotherapy for respiratory syncytial virus (RSV): Secondary | ICD-10-CM | POA: Diagnosis not present

## 2022-11-20 ENCOUNTER — Encounter: Payer: Self-pay | Admitting: Pulmonary Disease

## 2022-11-20 ENCOUNTER — Ambulatory Visit: Payer: Medicare HMO | Admitting: Pulmonary Disease

## 2022-11-20 VITALS — BP 128/76 | HR 91 | Temp 97.9°F | Ht 71.0 in | Wt 187.6 lb

## 2022-11-20 DIAGNOSIS — J438 Other emphysema: Secondary | ICD-10-CM

## 2022-11-20 DIAGNOSIS — R9389 Abnormal findings on diagnostic imaging of other specified body structures: Secondary | ICD-10-CM

## 2022-11-20 NOTE — Patient Instructions (Signed)
I will see you in about 6 months  Call with significant concerns  Call us if you are running out of the Symbicort

## 2022-11-20 NOTE — Progress Notes (Signed)
Ryan Mccarty    [id number]    1951-06-25  Primary Care Physician:Mccarty, Ryan Dallas, MD  Referring Physician: [place] Nation, MD [place],  [place] [address]  Chief complaint:    Patient with a history of emphysema, shortness of breath  HPI:  Following up with oncology  Did see a physician at [facility] who had a recent CT performed -Told about lung nodules and emphysema  Shortness of breath on exertion -Continues to smoke -Cutting down his cigarettes  At some point did have a stroke, exercise limitation, shortness of breath with minimal exertion Currently participating in rehab  An active smoker about a pack a day, did try to quit in the past, at some point was on Chantix and relates that he may have passed out related to Chantix  We did discuss the CT scan findings of extensive emphysema, risk with continuing to smoke, risk of neoplastic process with his smoking  Symbicort seems to be helping his breathing at present  Recently abnormal CT with multiple lung nodules and some abnormalities also noted on his kidney and adrenals-PET scan has been ordered and pending  Outpatient Encounter Medications as of 11/20/2022  Medication Sig   aspirin 81 MG chewable tablet Chew 1 tablet (81 mg total) by mouth daily.   budesonide-formoterol (SYMBICORT) 160-4.5 MCG/ACT inhaler Inhale 2 puffs into the lungs 2 (two) times daily.   albuterol (VENTOLIN HFA) 108 (90 Base) MCG/ACT inhaler Inhale 2 puffs into the lungs every 4 (four) hours as needed for wheezing or shortness of breath. (Patient not taking: Reported on 11/20/2022)   [DISCONTINUED] glipiZIDE (GLUCOTROL) 5 MG tablet Take 5 mg by mouth daily.   No facility-administered encounter medications on file as of 11/20/2022.    Allergies as of 11/20/2022   (No Known Allergies)    Past Medical History:  Diagnosis Date   Diabetes mellitus without complication ([facility])     Past Surgical History:  Procedure  Laterality Date   CYSTECTOMY     TONSILLECTOMY      Family History  Problem Relation Age of Onset   Cancer Father     Social History   Socioeconomic History   Marital status: Married    Spouse name: Ryan Mccarty   Number of children: Not on file   Years of education: 12   Highest education level: 12th grade  Occupational History   Not on file  Tobacco Use   Smoking status: Every Day    Packs/day: 1.00    Years: 55.00    Total pack years: 55.00    Types: Cigarettes    Passive exposure: Current   Smokeless tobacco: Never   Tobacco comments:    Smoking Cessation Offered  Vaping Use   Vaping Use: Never used  Substance and Sexual Activity   Alcohol use: Not Currently   Drug use: Never   Sexual activity: Yes    Partners: Female  Other Topics Concern   Not on file  Social History Narrative   Not on file   Social Determinants of Health   Financial Resource Strain: Low Risk  (10/21/2022)   Overall Financial Resource Strain (CARDIA)    Difficulty of Paying Living Expenses: Not hard at all  Food Insecurity: No Food Insecurity (10/21/2022)   Hunger Vital Sign    Worried About Running Out of Food in the Last Year: Never true    Ran Out of Food in the Last Year: Never  true  Transportation Needs: No Transportation Needs (10/21/2022)   PRAPARE - Hydrologist (Medical): No    Lack of Transportation (Non-Medical): No  Physical Activity: Inactive (10/21/2022)   Exercise Vital Sign    Days of Exercise per Week: 0 days    Minutes of Exercise per Session: 0 min  Stress: No Stress Concern Present (10/21/2022)   [place]    Feeling of Stress : Not at all  Social Connections: [place] (10/21/2022)   Social Connection and Isolation Panel [NHANES]    Frequency of Communication with Friends and Family: More than three times a week    Frequency of Social Gatherings with Friends  and Family: More than three times a week    Attends Religious Services: More than 4 times per year    Active Member of [employer] or Organizations: Yes    Attends Archivist Meetings: More than 4 times per year    Marital Status: Married  Human resources officer Violence: Not At Risk (10/21/2022)   Humiliation, Afraid, Rape, and Kick questionnaire    Fear of Current or Ex-Partner: No    Emotionally Abused: No    Physically Abused: No    Sexually Abused: No    Review of Systems  Constitutional:  Negative for fatigue.  Respiratory:  Positive for shortness of breath.     Vitals:   11/20/22 0939  BP: 128/76  Pulse: 91  Temp: 97.9 F (36.6 C)  SpO2: 96%     Physical Exam Constitutional:      Appearance: Normal appearance.  HENT:     Head: Normocephalic.     Mouth/Throat:     Mouth: Mucous membranes are moist.  Cardiovascular:     Rate and Rhythm: Normal rate and regular rhythm.     Heart sounds: No murmur heard.    No friction rub.  Pulmonary:     Effort: No respiratory distress.     Breath sounds: No stridor. No wheezing or rhonchi.  Musculoskeletal:     Cervical back: No rigidity or tenderness.  Neurological:     Mental Status: He is alert.  Psychiatric:        Mood and Affect: Mood normal.   Data Reviewed: Most recent CT scan of the chest reviewed with the patient-no previous CT to compare current 1 with-PET scan does reveal extensive emphysema and multiple lung nodules there is also Left renal cystic lesion, right adrenal nodule  PFT shows moderate obstructive disease  Assessment:  Multiple lung nodules -PET scan did not show significant activity in the lung nodules  Chronic obstructive pulmonary disease, emphysema -Continues on Symbicort as needed Active smoker -He is working on cutting down, not able to quit yet  Deconditioning -Encouraged to continue staying active   Plan/Recommendations: Continue Symbicort as needed  Smoking cessation  counseling  I will see him back in about 6 months  Encouraged to give [place] a call with any significant concerns    Sherrilyn Rist MD [facility] 11/20/2022, 9:47 AM  CC: Ryan Nation, MD

## 2022-11-21 ENCOUNTER — Ambulatory Visit (INDEPENDENT_AMBULATORY_CARE_PROVIDER_SITE_OTHER): Payer: Medicare HMO | Admitting: Urology

## 2022-11-21 VITALS — BP 146/79 | HR 100

## 2022-11-21 DIAGNOSIS — R350 Frequency of micturition: Secondary | ICD-10-CM | POA: Diagnosis not present

## 2022-11-21 DIAGNOSIS — N3941 Urge incontinence: Secondary | ICD-10-CM

## 2022-11-21 DIAGNOSIS — R32 Unspecified urinary incontinence: Secondary | ICD-10-CM

## 2022-11-21 DIAGNOSIS — R3915 Urgency of urination: Secondary | ICD-10-CM

## 2022-11-21 DIAGNOSIS — R972 Elevated prostate specific antigen [PSA]: Secondary | ICD-10-CM | POA: Diagnosis not present

## 2022-11-21 DIAGNOSIS — N138 Other obstructive and reflux uropathy: Secondary | ICD-10-CM | POA: Diagnosis not present

## 2022-11-21 DIAGNOSIS — N401 Enlarged prostate with lower urinary tract symptoms: Secondary | ICD-10-CM | POA: Diagnosis not present

## 2022-11-21 LAB — URINALYSIS, ROUTINE W REFLEX MICROSCOPIC
Bilirubin, UA: NEGATIVE
Ketones, UA: NEGATIVE
Leukocytes,UA: NEGATIVE
Nitrite, UA: NEGATIVE
Specific Gravity, UA: 1.03 (ref 1.005–1.030)
Urobilinogen, Ur: 1 mg/dL (ref 0.2–1.0)
pH, UA: 5.5 (ref 5.0–7.5)

## 2022-11-21 LAB — MICROSCOPIC EXAMINATION: Bacteria, UA: NONE SEEN

## 2022-11-21 NOTE — Progress Notes (Signed)
H&P  Chief Complaint: Large prostate  History of Present Illness: 71 year old male presents at this time for evaluation and management of a large prostate-both seen radiographically as well as lower urinary tract symptoms.  He was recently given tamsulosin to help his symptomatology (current IPSS 8, quality-of-life score 4) but this caused him to have the shakes and felt quite bad.  He only took 1 tamsulosin and stopped after that.  Worst issue is the urgency and urgency incontinence.  He goes through 3-4 depends every day.  He has had his symptoms worsening over the past 4 years.  He does have mobility limitations.  He has never sought urologic assistance before.  Most recent PSA is 8.49.  Past Medical History:  Diagnosis Date   Diabetes mellitus without complication ([place])     Past Surgical History:  Procedure Laterality Date   CYSTECTOMY     TONSILLECTOMY      Home Medications:  Allergies as of 11/21/2022   No Known Allergies      Medication List        Accurate as of November 21, 2022  6:21 AM. If you have any questions, ask your nurse or doctor.          albuterol 108 (90 Base) MCG/ACT inhaler Commonly known as: VENTOLIN HFA Inhale 2 puffs into the lungs every 4 (four) hours as needed for wheezing or shortness of breath.   aspirin 81 MG chewable tablet Chew 1 tablet (81 mg total) by mouth daily.   budesonide-formoterol 160-4.5 MCG/ACT inhaler Commonly known as: Symbicort Inhale 2 puffs into the lungs 2 (two) times daily.        Allergies: No Known Allergies  Family History  Problem Relation Age of Onset   Cancer Father     Social History:  reports that he has been smoking cigarettes. He has a 55.00 pack-year smoking history. He has been exposed to tobacco smoke. He has never used smokeless tobacco. He reports that he does not currently use alcohol. He reports that he does not use drugs.  ROS: A complete review of systems was performed.  All systems  are negative except for pertinent findings as noted.  Physical Exam:  Vital signs in last 24 hours: There were no vitals taken for this visit. Constitutional:  Alert and oriented, No acute distress Cardiovascular: Regular rate  Respiratory: Normal respiratory effort GI: Abdomen is soft, nontender, nondistended, no abdominal masses. No CVAT.  Genitourinary: Normal male phallus, testes are descended bilaterally and non-tender and without masses, scrotum is normal in appearance without lesions or masses, perineum is normal on inspection.  Normal anal sphincter tone.  Gland over 100 g, symmetric, nonnodular. Lymphatic: No lymphadenopathy Neurologic: Grossly intact, no focal deficits Psychiatric: Normal mood and affect  I have reviewed prior pt notes  I have reviewed notes from referring/previous physicians  I have reviewed urinalysis results  I have independently reviewed prior imaging--CT images reviewed  I have reviewed prior PSA results--recently 8.49  I have reviewed IPSS form   Impression/Assessment:  1.  BPH-huge gland.  He does empty quite well, biggest issue is urgency and frequency  2.  Elevated PSA.  I think proportionate to the size of his prostate.  At this point I do not think biopsy is necessary  3.  Urgency, frequency, urgency incontinence, bothersome.  He does have mobility restrictions, however.  Plan:  1.  Overactive bladder guide sheet given  2.  I explained to him I do not think we  need to proceed with ultrasound and biopsy of his prostate at this time especially with his benign feeling gland  3.  I will give him 6 weeks of Gemtesa to take, I will see back at about that time for recheck

## 2023-01-02 ENCOUNTER — Ambulatory Visit: Payer: Medicare HMO | Admitting: Urology

## 2023-01-02 ENCOUNTER — Encounter: Payer: Self-pay | Admitting: Urology

## 2023-01-02 VITALS — BP 145/72 | HR 92

## 2023-01-02 DIAGNOSIS — R32 Unspecified urinary incontinence: Secondary | ICD-10-CM

## 2023-01-02 DIAGNOSIS — R350 Frequency of micturition: Secondary | ICD-10-CM | POA: Diagnosis not present

## 2023-01-02 DIAGNOSIS — R3915 Urgency of urination: Secondary | ICD-10-CM | POA: Diagnosis not present

## 2023-01-02 DIAGNOSIS — N401 Enlarged prostate with lower urinary tract symptoms: Secondary | ICD-10-CM | POA: Diagnosis not present

## 2023-01-02 DIAGNOSIS — N5201 Erectile dysfunction due to arterial insufficiency: Secondary | ICD-10-CM | POA: Diagnosis not present

## 2023-01-02 DIAGNOSIS — R972 Elevated prostate specific antigen [PSA]: Secondary | ICD-10-CM | POA: Diagnosis not present

## 2023-01-02 DIAGNOSIS — N138 Other obstructive and reflux uropathy: Secondary | ICD-10-CM

## 2023-01-02 LAB — URINALYSIS, ROUTINE W REFLEX MICROSCOPIC
Bilirubin, UA: NEGATIVE
Glucose, UA: NEGATIVE
Ketones, UA: NEGATIVE
Leukocytes,UA: NEGATIVE
Nitrite, UA: NEGATIVE
RBC, UA: NEGATIVE
Specific Gravity, UA: 1.03 (ref 1.005–1.030)
Urobilinogen, Ur: 0.2 mg/dL (ref 0.2–1.0)
pH, UA: 5.5 (ref 5.0–7.5)

## 2023-01-02 LAB — MICROSCOPIC EXAMINATION
Bacteria, UA: NONE SEEN
RBC, Urine: NONE SEEN /hpf (ref 0–2)

## 2023-01-02 MED ORDER — SOLIFENACIN SUCCINATE 10 MG PO TABS: 10.0000 mg | ORAL_TABLET | Freq: Every day | ORAL | 11 refills | Status: AC

## 2023-01-02 MED ORDER — SILDENAFIL CITRATE 100 MG PO TABS: 100.0000 mg | ORAL_TABLET | Freq: Every day | ORAL | 99 refills | Status: DC | PRN

## 2023-01-02 NOTE — Progress Notes (Signed)
History of Present Illness:   12.5.20243:  72 year old male presents at this time for E/M of a large prostate-both seen radiographically as well as presence of lower urinary tract symptoms.   He was recently given tamsulosin to help his symptomatology (current IPSS 8, quality-of-life score 4) but this caused him to have the shakes and felt quite bad.  He only took 1 tamsulosin and stopped after that.   Worst issue is the urgency and urgency incontinence.  He goes through 3-4 depends every day.  He has had his symptoms worsening over the past 4 years.  He does have mobility limitations.  He has never sought urologic assistance before.   Most recent PSA is 8.49.  1.16.2024: Here for follow-up.  At his initial visit he was found to be emptying well.  I did not necessarily think he needed ultrasound and biopsy as he had a very large gland, estimated at 100 g.  He was given an overactive bladder guide sheet and 6 weeks of Gemtesa samples.  He returns for recheck.  He states that he has fewer wet pads a day.  He only has 2-3 pads a uses.  He has had somewhat of a decrease in frequency and urgency.  IPSS 16, quality-of-life score 4.   Past Medical History:  Diagnosis Date   Diabetes mellitus without complication ([place])     Past Surgical History:  Procedure Laterality Date   CYSTECTOMY     TONSILLECTOMY      Home Medications:  Allergies as of 01/02/2023   No Known Allergies      Medication List        Accurate as of January 02, 2023  8:32 AM. If you have any questions, ask your nurse or doctor.          albuterol 108 (90 Base) MCG/ACT inhaler Commonly known as: VENTOLIN HFA Inhale 2 puffs into the lungs every 4 (four) hours as needed for wheezing or shortness of breath.   aspirin 81 MG chewable tablet Chew 1 tablet (81 mg total) by mouth daily.   budesonide-formoterol 160-4.5 MCG/ACT inhaler Commonly known as: Symbicort Inhale 2 puffs into the lungs 2 (two) times daily.         Allergies: No Known Allergies  Family History  Problem Relation Age of Onset   Cancer Father     Social History:  reports that he has been smoking cigarettes. He has a 55.00 pack-year smoking history. He has been exposed to tobacco smoke. He has never used smokeless tobacco. He reports that he does not currently use alcohol. He reports that he does not use drugs.  ROS: A complete review of systems was performed.  All systems are negative except for pertinent findings as noted.  Physical Exam:  Vital signs in last 24 hours: There were no vitals taken for this visit. Constitutional:  Alert and oriented, No acute distress Cardiovascular: Regular rate  Respiratory: Normal respiratory effort Psychiatric: Normal mood and affect  I have reviewed prior pt notes  I have reviewed urinalysis results  I have independently reviewed prior imaging--calculated prostate volume 1 most recent CT scan 150 mL  I have reviewed prior PSA results    Impression/Assessment:  1.  BPH with large gland, volume estimate 150 mL.  He empties out well.  2.  Urinary urgency, frequency, urgency incontinence.  Improved somewhat with Gemtesa  3.  ED, he desires treatment  Plan:  1.  I changed him from [place] samples to Solifenacin 10  mg daily  2.  Sildenafil sent in  3.  I will see him back in 6 months for recheck.

## 2023-01-18 DIAGNOSIS — D471 Chronic myeloproliferative disease: Secondary | ICD-10-CM | POA: Diagnosis not present

## 2023-01-18 DIAGNOSIS — D75839 Thrombocytosis, unspecified: Secondary | ICD-10-CM | POA: Diagnosis not present

## 2023-01-25 DIAGNOSIS — R4689 Other symptoms and signs involving appearance and behavior: Secondary | ICD-10-CM | POA: Diagnosis not present

## 2023-01-25 DIAGNOSIS — Z125 Encounter for screening for malignant neoplasm of prostate: Secondary | ICD-10-CM | POA: Diagnosis not present

## 2023-01-25 DIAGNOSIS — N400 Enlarged prostate without lower urinary tract symptoms: Secondary | ICD-10-CM | POA: Diagnosis not present

## 2023-01-25 DIAGNOSIS — D471 Chronic myeloproliferative disease: Secondary | ICD-10-CM | POA: Diagnosis not present

## 2023-01-25 DIAGNOSIS — R4189 Other symptoms and signs involving cognitive functions and awareness: Secondary | ICD-10-CM | POA: Diagnosis not present

## 2023-01-25 DIAGNOSIS — G9389 Other specified disorders of brain: Secondary | ICD-10-CM | POA: Diagnosis not present

## 2023-02-13 DIAGNOSIS — R42 Dizziness and giddiness: Secondary | ICD-10-CM | POA: Diagnosis not present

## 2023-02-13 DIAGNOSIS — J449 Chronic obstructive pulmonary disease, unspecified: Secondary | ICD-10-CM | POA: Diagnosis not present

## 2023-02-13 DIAGNOSIS — E119 Type 2 diabetes mellitus without complications: Secondary | ICD-10-CM | POA: Diagnosis not present

## 2023-02-13 DIAGNOSIS — R270 Ataxia, unspecified: Secondary | ICD-10-CM | POA: Diagnosis not present

## 2023-02-13 DIAGNOSIS — R351 Nocturia: Secondary | ICD-10-CM | POA: Diagnosis not present

## 2023-02-13 DIAGNOSIS — Z720 Tobacco use: Secondary | ICD-10-CM | POA: Diagnosis not present

## 2023-02-13 DIAGNOSIS — Z8679 Personal history of other diseases of the circulatory system: Secondary | ICD-10-CM | POA: Diagnosis not present

## 2023-02-13 DIAGNOSIS — N400 Enlarged prostate without lower urinary tract symptoms: Secondary | ICD-10-CM | POA: Diagnosis not present

## 2023-02-13 DIAGNOSIS — Z0001 Encounter for general adult medical examination with abnormal findings: Secondary | ICD-10-CM | POA: Diagnosis not present

## 2023-02-19 DIAGNOSIS — J9811 Atelectasis: Secondary | ICD-10-CM | POA: Diagnosis not present

## 2023-02-19 DIAGNOSIS — R911 Solitary pulmonary nodule: Secondary | ICD-10-CM | POA: Diagnosis not present

## 2023-02-19 DIAGNOSIS — R918 Other nonspecific abnormal finding of lung field: Secondary | ICD-10-CM | POA: Diagnosis not present

## 2023-02-19 DIAGNOSIS — J439 Emphysema, unspecified: Secondary | ICD-10-CM | POA: Diagnosis not present

## 2023-02-22 DIAGNOSIS — E875 Hyperkalemia: Secondary | ICD-10-CM | POA: Diagnosis not present

## 2023-02-22 DIAGNOSIS — N289 Disorder of kidney and ureter, unspecified: Secondary | ICD-10-CM | POA: Diagnosis not present

## 2023-02-27 DIAGNOSIS — D3502 Benign neoplasm of left adrenal gland: Secondary | ICD-10-CM | POA: Diagnosis not present

## 2023-02-27 DIAGNOSIS — E349 Endocrine disorder, unspecified: Secondary | ICD-10-CM | POA: Diagnosis not present

## 2023-02-27 DIAGNOSIS — I700 Atherosclerosis of aorta: Secondary | ICD-10-CM | POA: Diagnosis not present

## 2023-02-27 DIAGNOSIS — N400 Enlarged prostate without lower urinary tract symptoms: Secondary | ICD-10-CM | POA: Diagnosis not present

## 2023-02-27 DIAGNOSIS — K8020 Calculus of gallbladder without cholecystitis without obstruction: Secondary | ICD-10-CM | POA: Diagnosis not present

## 2023-02-27 DIAGNOSIS — R9341 Abnormal radiologic findings on diagnostic imaging of renal pelvis, ureter, or bladder: Secondary | ICD-10-CM | POA: Diagnosis not present

## 2023-02-27 DIAGNOSIS — N281 Cyst of kidney, acquired: Secondary | ICD-10-CM | POA: Diagnosis not present

## 2023-02-27 DIAGNOSIS — D3501 Benign neoplasm of right adrenal gland: Secondary | ICD-10-CM | POA: Diagnosis not present

## 2023-02-27 DIAGNOSIS — N200 Calculus of kidney: Secondary | ICD-10-CM | POA: Diagnosis not present

## 2023-03-21 DIAGNOSIS — K439 Ventral hernia without obstruction or gangrene: Secondary | ICD-10-CM | POA: Diagnosis not present

## 2023-03-21 DIAGNOSIS — Z1211 Encounter for screening for malignant neoplasm of colon: Secondary | ICD-10-CM | POA: Diagnosis not present

## 2023-03-27 DIAGNOSIS — E1151 Type 2 diabetes mellitus with diabetic peripheral angiopathy without gangrene: Secondary | ICD-10-CM | POA: Diagnosis not present

## 2023-03-27 DIAGNOSIS — Z720 Tobacco use: Secondary | ICD-10-CM | POA: Diagnosis not present

## 2023-03-27 DIAGNOSIS — E1122 Type 2 diabetes mellitus with diabetic chronic kidney disease: Secondary | ICD-10-CM | POA: Diagnosis not present

## 2023-03-27 DIAGNOSIS — J449 Chronic obstructive pulmonary disease, unspecified: Secondary | ICD-10-CM | POA: Diagnosis not present

## 2023-03-27 DIAGNOSIS — R270 Ataxia, unspecified: Secondary | ICD-10-CM | POA: Diagnosis not present

## 2023-03-27 DIAGNOSIS — R351 Nocturia: Secondary | ICD-10-CM | POA: Diagnosis not present

## 2023-03-27 DIAGNOSIS — Z8679 Personal history of other diseases of the circulatory system: Secondary | ICD-10-CM | POA: Diagnosis not present

## 2023-03-27 DIAGNOSIS — I739 Peripheral vascular disease, unspecified: Secondary | ICD-10-CM | POA: Diagnosis not present

## 2023-03-27 DIAGNOSIS — N400 Enlarged prostate without lower urinary tract symptoms: Secondary | ICD-10-CM | POA: Diagnosis not present

## 2023-03-27 DIAGNOSIS — E119 Type 2 diabetes mellitus without complications: Secondary | ICD-10-CM | POA: Diagnosis not present

## 2023-03-27 DIAGNOSIS — R42 Dizziness and giddiness: Secondary | ICD-10-CM | POA: Diagnosis not present

## 2023-04-25 DIAGNOSIS — Z1589 Genetic susceptibility to other disease: Secondary | ICD-10-CM | POA: Diagnosis not present

## 2023-04-25 DIAGNOSIS — D471 Chronic myeloproliferative disease: Secondary | ICD-10-CM | POA: Diagnosis not present

## 2023-04-25 DIAGNOSIS — D72829 Elevated white blood cell count, unspecified: Secondary | ICD-10-CM | POA: Diagnosis not present

## 2023-04-25 DIAGNOSIS — D45 Polycythemia vera: Secondary | ICD-10-CM | POA: Diagnosis not present

## 2023-04-25 DIAGNOSIS — G9389 Other specified disorders of brain: Secondary | ICD-10-CM | POA: Diagnosis not present

## 2023-04-25 DIAGNOSIS — R4189 Other symptoms and signs involving cognitive functions and awareness: Secondary | ICD-10-CM | POA: Diagnosis not present

## 2023-04-25 DIAGNOSIS — R4689 Other symptoms and signs involving appearance and behavior: Secondary | ICD-10-CM | POA: Diagnosis not present

## 2023-06-05 ENCOUNTER — Encounter: Payer: Self-pay | Admitting: Urology

## 2023-06-05 ENCOUNTER — Ambulatory Visit: Payer: Medicare HMO | Admitting: Urology

## 2023-06-05 VITALS — BP 106/67 | HR 82

## 2023-06-05 DIAGNOSIS — N138 Other obstructive and reflux uropathy: Secondary | ICD-10-CM | POA: Diagnosis not present

## 2023-06-05 DIAGNOSIS — R32 Unspecified urinary incontinence: Secondary | ICD-10-CM | POA: Diagnosis not present

## 2023-06-05 DIAGNOSIS — N5201 Erectile dysfunction due to arterial insufficiency: Secondary | ICD-10-CM | POA: Diagnosis not present

## 2023-06-05 DIAGNOSIS — N401 Enlarged prostate with lower urinary tract symptoms: Secondary | ICD-10-CM | POA: Diagnosis not present

## 2023-06-05 DIAGNOSIS — R3915 Urgency of urination: Secondary | ICD-10-CM

## 2023-06-05 DIAGNOSIS — R350 Frequency of micturition: Secondary | ICD-10-CM | POA: Diagnosis not present

## 2023-06-05 DIAGNOSIS — R972 Elevated prostate specific antigen [PSA]: Secondary | ICD-10-CM | POA: Diagnosis not present

## 2023-06-05 LAB — MICROSCOPIC EXAMINATION
Bacteria, UA: NONE SEEN
WBC, UA: NONE SEEN /hpf (ref 0–5)

## 2023-06-05 LAB — URINALYSIS, ROUTINE W REFLEX MICROSCOPIC
Bilirubin, UA: NEGATIVE
Ketones, UA: NEGATIVE
Leukocytes,UA: NEGATIVE
Nitrite, UA: NEGATIVE
RBC, UA: NEGATIVE
Specific Gravity, UA: 1.025 (ref 1.005–1.030)
Urobilinogen, Ur: 1 mg/dL (ref 0.2–1.0)
pH, UA: 6 (ref 5.0–7.5)

## 2023-06-05 NOTE — Progress Notes (Signed)
History of Present Illness: Ryan Mccarty returns for continued f/u of BPH w/ LUTS as well as elevated PSA.  He has significant neurologic issues.  He has limited mobility.  He empties his bladder fairly well on bladder scans, but is almost totally incontinent.  He states that almost all of the urine produced daily comes out in diapers.  Also wets at night.  I gave him samples of Gemtesa at his last visit.  3 days after taking these he had convulsive symptoms, he since stopped that.  Has a prescription for Solifenacin.  Not aware that he is taking it.  Most recent PSA is 8.49.  He has a very large gland, estimated at greater than 100 mL.  He has not had prostate biopsy.  Past Medical History:  Diagnosis Date   Diabetes mellitus without complication ([facility])     Past Surgical History:  Procedure Laterality Date   CYSTECTOMY     TONSILLECTOMY      Home Medications:  Allergies as of 06/05/2023   No Known Allergies      Medication List        Accurate as of June 05, 2023  7:37 AM. If you have any questions, ask your nurse or doctor.          albuterol 108 (90 Base) MCG/ACT inhaler Commonly known as: VENTOLIN HFA Inhale 2 puffs into the lungs every 4 (four) hours as needed for wheezing or shortness of breath.   aspirin 81 MG chewable tablet Chew 1 tablet (81 mg total) by mouth daily.   budesonide-formoterol 160-4.5 MCG/ACT inhaler Commonly known as: Symbicort Inhale 2 puffs into the lungs 2 (two) times daily.   sildenafil 100 MG tablet Commonly known as: VIAGRA Take 1 tablet (100 mg total) by mouth daily as needed for erectile dysfunction.   solifenacin 10 MG tablet Commonly known as: VESICARE Take 1 tablet (10 mg total) by mouth daily.        Allergies: No Known Allergies  Family History  Problem Relation Age of Onset   Cancer Father     Social History:  reports that he has been smoking cigarettes. He has a 55.00 pack-year smoking history. He has been exposed to  tobacco smoke. He has never used smokeless tobacco. He reports that he does not currently use alcohol. He reports that he does not use drugs.  ROS: A complete review of systems was performed.  All systems are negative except for pertinent findings as noted.  Physical Exam:  Vital signs in last 24 hours: There were no vitals taken for this visit. Constitutional:  Alert and oriented, No acute distress Cardiovascular: Regular rate  Respiratory: Normal respiratory effort Neurologic: Grossly intact, no focal deficits Psychiatric: Normal mood and affect  I have reviewed prior pt notes  I have reviewed notes from referring/previous physicians  I have reviewed urinalysis results  I have independently reviewed prior imaging--most recent CT scan from March of this year estimated prostate size 130 mL  I have reviewed prior PSA results    Impression/Assessment:  1.  BPH.  Very large gland.  Empties well.  Significant lower urinary tract symptoms, near-total incontinence but he has multiple other medical problems going on  2.  Elevated PSA.  No biopsy as of yet but his prostate is quite large which more than likely explains his PSA elevation  3.  ED, has prescription for sildenafil but he has not used it  Plan:  1.  I will have his PSA checked  today.  Continue surveillance unless significant bump  2.  I will have him come back in 6 months for recheck  3.  He apparently has a prescription for Solifenacin.  I recommended that he try to start this if he is not currently on it

## 2023-06-06 LAB — PSA: Prostate Specific Ag, Serum: 13.4 ng/mL — ABNORMAL HIGH (ref 0.0–4.0)

## 2023-06-19 ENCOUNTER — Encounter: Payer: Self-pay | Admitting: Neurology

## 2023-06-19 ENCOUNTER — Ambulatory Visit: Payer: Medicare HMO | Admitting: Neurology

## 2023-06-19 VITALS — BP 130/78 | HR 89 | Ht 71.0 in | Wt 187.6 lb

## 2023-06-19 DIAGNOSIS — W19XXXS Unspecified fall, sequela: Secondary | ICD-10-CM

## 2023-06-19 DIAGNOSIS — R748 Abnormal levels of other serum enzymes: Secondary | ICD-10-CM | POA: Diagnosis not present

## 2023-06-19 DIAGNOSIS — R269 Unspecified abnormalities of gait and mobility: Secondary | ICD-10-CM | POA: Diagnosis not present

## 2023-06-19 DIAGNOSIS — R32 Unspecified urinary incontinence: Secondary | ICD-10-CM

## 2023-06-19 DIAGNOSIS — R799 Abnormal finding of blood chemistry, unspecified: Secondary | ICD-10-CM | POA: Diagnosis not present

## 2023-06-19 DIAGNOSIS — R7989 Other specified abnormal findings of blood chemistry: Secondary | ICD-10-CM | POA: Diagnosis not present
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
## 2023-06-20 LAB — ANA W/REFLEX IF POSITIVE: Anti Nuclear Antibody (ANA): NEGATIVE

## 2023-06-20 LAB — THYROID PANEL WITH TSH
Free Thyroxine Index: 2 (ref 1.2–4.9)
T3 Uptake Ratio: 30 % (ref 24–39)
T4, Total: 6.5 ug/dL (ref 4.5–12.0)
TSH: 0.814 u[IU]/mL (ref 0.450–4.500)

## 2023-06-20 LAB — CK: Total CK: 34 U/L — ABNORMAL LOW (ref 41–331)

## 2023-06-27 ENCOUNTER — Telehealth: Payer: Self-pay | Admitting: Neurology

## 2023-06-27 NOTE — Telephone Encounter (Signed)
MRI lumbar spine [facility] auth: [id number] exp. 06/27/2023 - 08/24/2023 MRI brain [facility] auth: #[id number] exp. 06/27/2023 - 08/26/2023 sent to GI [phone]

## 2023-07-03 ENCOUNTER — Ambulatory Visit: Payer: Medicare HMO | Admitting: Urology

## 2023-07-26 DIAGNOSIS — D471 Chronic myeloproliferative disease: Secondary | ICD-10-CM | POA: Diagnosis not present

## 2023-07-26 DIAGNOSIS — D72829 Elevated white blood cell count, unspecified: Secondary | ICD-10-CM | POA: Diagnosis not present

## 2023-07-26 DIAGNOSIS — Z1589 Genetic susceptibility to other disease: Secondary | ICD-10-CM | POA: Diagnosis not present

## 2023-07-27 DIAGNOSIS — Z720 Tobacco use: Secondary | ICD-10-CM | POA: Diagnosis not present

## 2023-07-27 DIAGNOSIS — Z716 Tobacco abuse counseling: Secondary | ICD-10-CM | POA: Diagnosis not present

## 2023-07-27 DIAGNOSIS — Z1589 Genetic susceptibility to other disease: Secondary | ICD-10-CM | POA: Diagnosis not present

## 2023-07-27 DIAGNOSIS — R911 Solitary pulmonary nodule: Secondary | ICD-10-CM | POA: Diagnosis not present

## 2023-07-27 DIAGNOSIS — D471 Chronic myeloproliferative disease: Secondary | ICD-10-CM | POA: Diagnosis not present

## 2023-07-27 DIAGNOSIS — Z87891 Personal history of nicotine dependence: Secondary | ICD-10-CM | POA: Diagnosis not present

## 2023-07-28 ENCOUNTER — Other Ambulatory Visit: Payer: Medicare HMO

## 2023-07-31 ENCOUNTER — Other Ambulatory Visit: Payer: Medicare HMO

## 2023-08-09 ENCOUNTER — Inpatient Hospital Stay: Admission: RE | Admit: 2023-08-09 | Payer: Medicare HMO | Source: Ambulatory Visit

## 2023-08-09 ENCOUNTER — Other Ambulatory Visit: Payer: Medicare HMO

## 2023-08-13 ENCOUNTER — Other Ambulatory Visit: Payer: Medicare HMO

## 2023-09-11 ENCOUNTER — Encounter: Payer: Self-pay | Admitting: Pulmonary Disease

## 2023-09-11 ENCOUNTER — Ambulatory Visit: Payer: Medicare HMO | Admitting: Pulmonary Disease

## 2023-09-27 DIAGNOSIS — R42 Dizziness and giddiness: Secondary | ICD-10-CM | POA: Diagnosis not present

## 2023-09-27 DIAGNOSIS — R351 Nocturia: Secondary | ICD-10-CM | POA: Diagnosis not present

## 2023-09-27 DIAGNOSIS — Z1321 Encounter for screening for nutritional disorder: Secondary | ICD-10-CM | POA: Diagnosis not present

## 2023-09-27 DIAGNOSIS — Z23 Encounter for immunization: Secondary | ICD-10-CM | POA: Diagnosis not present

## 2023-09-27 DIAGNOSIS — J449 Chronic obstructive pulmonary disease, unspecified: Secondary | ICD-10-CM | POA: Diagnosis not present

## 2023-09-27 DIAGNOSIS — R270 Ataxia, unspecified: Secondary | ICD-10-CM | POA: Diagnosis not present

## 2023-09-27 DIAGNOSIS — Z7289 Other problems related to lifestyle: Secondary | ICD-10-CM | POA: Diagnosis not present

## 2023-09-27 DIAGNOSIS — Z8679 Personal history of other diseases of the circulatory system: Secondary | ICD-10-CM | POA: Diagnosis not present

## 2023-09-27 DIAGNOSIS — I739 Peripheral vascular disease, unspecified: Secondary | ICD-10-CM | POA: Diagnosis not present

## 2023-09-27 DIAGNOSIS — Z0001 Encounter for general adult medical examination with abnormal findings: Secondary | ICD-10-CM | POA: Diagnosis not present

## 2023-09-27 DIAGNOSIS — D45 Polycythemia vera: Secondary | ICD-10-CM | POA: Diagnosis not present

## 2023-09-27 DIAGNOSIS — N400 Enlarged prostate without lower urinary tract symptoms: Secondary | ICD-10-CM | POA: Diagnosis not present

## 2023-09-27 DIAGNOSIS — Z720 Tobacco use: Secondary | ICD-10-CM | POA: Diagnosis not present

## 2023-09-27 DIAGNOSIS — Z1329 Encounter for screening for other suspected endocrine disorder: Secondary | ICD-10-CM | POA: Diagnosis not present

## 2023-09-27 DIAGNOSIS — E119 Type 2 diabetes mellitus without complications: Secondary | ICD-10-CM | POA: Diagnosis not present

## 2023-10-08 DIAGNOSIS — R911 Solitary pulmonary nodule: Secondary | ICD-10-CM | POA: Diagnosis not present

## 2023-10-08 DIAGNOSIS — I700 Atherosclerosis of aorta: Secondary | ICD-10-CM | POA: Diagnosis not present

## 2023-10-08 DIAGNOSIS — R918 Other nonspecific abnormal finding of lung field: Secondary | ICD-10-CM | POA: Diagnosis not present

## 2023-10-25 DIAGNOSIS — R911 Solitary pulmonary nodule: Secondary | ICD-10-CM | POA: Diagnosis not present

## 2023-10-25 DIAGNOSIS — D471 Chronic myeloproliferative disease: Secondary | ICD-10-CM | POA: Diagnosis not present

## 2023-10-29 DIAGNOSIS — I739 Peripheral vascular disease, unspecified: Secondary | ICD-10-CM | POA: Diagnosis not present

## 2023-10-29 DIAGNOSIS — Z720 Tobacco use: Secondary | ICD-10-CM | POA: Diagnosis not present

## 2023-10-29 DIAGNOSIS — E119 Type 2 diabetes mellitus without complications: Secondary | ICD-10-CM | POA: Diagnosis not present

## 2023-10-29 DIAGNOSIS — R351 Nocturia: Secondary | ICD-10-CM | POA: Diagnosis not present

## 2023-10-29 DIAGNOSIS — J449 Chronic obstructive pulmonary disease, unspecified: Secondary | ICD-10-CM | POA: Diagnosis not present

## 2023-10-29 DIAGNOSIS — R270 Ataxia, unspecified: Secondary | ICD-10-CM | POA: Diagnosis not present

## 2023-10-29 DIAGNOSIS — N400 Enlarged prostate without lower urinary tract symptoms: Secondary | ICD-10-CM | POA: Diagnosis not present

## 2023-10-29 DIAGNOSIS — R42 Dizziness and giddiness: Secondary | ICD-10-CM | POA: Diagnosis not present

## 2023-10-29 DIAGNOSIS — Z8679 Personal history of other diseases of the circulatory system: Secondary | ICD-10-CM | POA: Diagnosis not present

## 2023-11-27 NOTE — Progress Notes (Signed)
History of Present Illness: Ryan Mccarty returns for continued f/u of BPH w/ LUTS as well as elevated PSA.  He has significant neurologic issues.  He has limited mobility.  He empties his bladder fairly well on bladder scans, but is almost totally incontinent.  He states that almost all of the urine produced daily comes out in diapers.  Also wets at night.  I gave him samples of Gemtesa at his last visit.  3 days after taking these he had convulsive symptoms, he since stopped that.  Has a prescription for Solifenacin.  Not aware that he is taking it.  12.17.2024: Patient here for routine check.  PSA in June was 13.4.  Still with significant lower urinary tract symptoms, empties most of his urine into his pull-ups.  He is not taking Solifenacin.  He has had no burning with urination, no blood in his urine, no foul-smelling or cloudy urine.  He tells me his [facility] and was in the 12 range.  Past Medical History:  Diagnosis Date   Diabetes mellitus without complication ([facility])     Past Surgical History:  Procedure Laterality Date   CYSTECTOMY     TONSILLECTOMY      Home Medications:  Allergies as of 12/04/2023   No Known Allergies      Medication List        Accurate as of November 27, 2023  8:27 AM. If you have any questions, ask your nurse or doctor.          amLODipine 5 MG tablet Commonly known as: NORVASC Take 5 mg by mouth daily.   aspirin 81 MG chewable tablet Chew 1 tablet (81 mg total) by mouth daily.   budesonide-formoterol 160-4.5 MCG/ACT inhaler Commonly known as: Symbicort Inhale 2 puffs into the lungs 2 (two) times daily.   solifenacin 10 MG tablet Commonly known as: VESICARE Take 1 tablet (10 mg total) by mouth daily.        Allergies: No Known Allergies  Family History  Problem Relation Age of Onset   Cancer Father     Social History:  reports that he has been smoking cigarettes. He has a 55 pack-year smoking history. He  has been exposed to tobacco smoke. He has never used smokeless tobacco. He reports that he does not currently use alcohol. He reports that he does not use drugs.  ROS: A complete review of systems was performed.  All systems are negative except for pertinent findings as noted.  Physical Exam:  Vital signs in last 24 hours: There were no vitals taken for this visit. Constitutional:  Alert and oriented, No acute distress Cardiovascular: Regular rate  Respiratory: Normal respiratory effort Neurologic: Grossly intact, no focal deficits Psychiatric: Normal mood and affect  I have reviewed prior pt notes  I have reviewed notes from referring/previous physicians  I have reviewed urinalysis results--clear  I have independently reviewed prior imaging--most recent CT scan from March of this year estimated prostate size 130 mL I reviewed images with the patient.  I have reviewed prior PSA results    Impression/Assessment:  1.  BPH w/ very large gland.  Significant symptomatology with near-total incontinence.  He does empty well.  2.  Elevated PSA.  Over 13 in the summer, 12 recently.  3.  ED, not treated  Plan:  1.  I discussed with him proceeding with either a biopsy of the prostate or an MRI.  I think an MRI is fine at this  point  2.  We will get this performed, call with results and follow-up.

## 2023-12-04 ENCOUNTER — Encounter: Payer: Self-pay | Admitting: Urology

## 2023-12-04 ENCOUNTER — Ambulatory Visit: Payer: Medicare HMO | Admitting: Urology

## 2023-12-04 VITALS — BP 115/65 | HR 109

## 2023-12-04 DIAGNOSIS — N5201 Erectile dysfunction due to arterial insufficiency: Secondary | ICD-10-CM

## 2023-12-04 DIAGNOSIS — N401 Enlarged prostate with lower urinary tract symptoms: Secondary | ICD-10-CM | POA: Diagnosis not present

## 2023-12-04 DIAGNOSIS — R972 Elevated prostate specific antigen [PSA]: Secondary | ICD-10-CM | POA: Diagnosis not present

## 2023-12-04 DIAGNOSIS — R350 Frequency of micturition: Secondary | ICD-10-CM | POA: Diagnosis not present

## 2023-12-04 DIAGNOSIS — R32 Unspecified urinary incontinence: Secondary | ICD-10-CM | POA: Diagnosis not present

## 2023-12-04 DIAGNOSIS — N138 Other obstructive and reflux uropathy: Secondary | ICD-10-CM

## 2023-12-04 DIAGNOSIS — R3915 Urgency of urination: Secondary | ICD-10-CM

## 2023-12-04 LAB — URINALYSIS, ROUTINE W REFLEX MICROSCOPIC
Bilirubin, UA: NEGATIVE
Glucose, UA: NEGATIVE
Ketones, UA: NEGATIVE
Leukocytes,UA: NEGATIVE
Nitrite, UA: NEGATIVE
RBC, UA: NEGATIVE
Specific Gravity, UA: 1.03 (ref 1.005–1.030)
Urobilinogen, Ur: 1 mg/dL (ref 0.2–1.0)
pH, UA: 6 (ref 5.0–7.5)

## 2023-12-04 LAB — MICROSCOPIC EXAMINATION: Bacteria, UA: NONE SEEN

## 2024-01-22 DIAGNOSIS — D471 Chronic myeloproliferative disease: Secondary | ICD-10-CM | POA: Diagnosis not present

## 2024-01-25 DIAGNOSIS — D471 Chronic myeloproliferative disease: Secondary | ICD-10-CM | POA: Diagnosis not present

## 2024-05-23 DIAGNOSIS — D471 Chronic myeloproliferative disease: Secondary | ICD-10-CM | POA: Diagnosis not present

## 2024-05-30 DIAGNOSIS — D471 Chronic myeloproliferative disease: Secondary | ICD-10-CM | POA: Diagnosis not present

## 2024-06-03 ENCOUNTER — Ambulatory Visit (HOSPITAL_COMMUNITY): Attending: Urology

## 2025-01-16 ENCOUNTER — Inpatient Hospital Stay

## 2025-01-16 ENCOUNTER — Inpatient Hospital Stay: Admitting: Hematology

## 2025-02-13 ENCOUNTER — Inpatient Hospital Stay
